# Patient Record
Sex: Female | Born: 2000 | Race: White | Hispanic: No | Marital: Single | Smoking: Never smoker
Health system: Southern US, Community
[De-identification: ages and names within clinical notes are randomized; demographics above are authoritative.]

## PROBLEM LIST (undated history)

## (undated) ENCOUNTER — Inpatient Hospital Stay: Payer: Self-pay

## (undated) DIAGNOSIS — F429 Obsessive-compulsive disorder, unspecified: Secondary | ICD-10-CM

## (undated) DIAGNOSIS — E785 Hyperlipidemia, unspecified: Secondary | ICD-10-CM

## (undated) DIAGNOSIS — F319 Bipolar disorder, unspecified: Secondary | ICD-10-CM

## (undated) DIAGNOSIS — K219 Gastro-esophageal reflux disease without esophagitis: Secondary | ICD-10-CM

## (undated) DIAGNOSIS — F913 Oppositional defiant disorder: Secondary | ICD-10-CM

## (undated) DIAGNOSIS — F988 Other specified behavioral and emotional disorders with onset usually occurring in childhood and adolescence: Secondary | ICD-10-CM

## (undated) DIAGNOSIS — F419 Anxiety disorder, unspecified: Secondary | ICD-10-CM

## (undated) DIAGNOSIS — F329 Major depressive disorder, single episode, unspecified: Secondary | ICD-10-CM

## (undated) DIAGNOSIS — J45909 Unspecified asthma, uncomplicated: Secondary | ICD-10-CM

## (undated) DIAGNOSIS — F909 Attention-deficit hyperactivity disorder, unspecified type: Secondary | ICD-10-CM

## (undated) DIAGNOSIS — F32A Depression, unspecified: Secondary | ICD-10-CM

## (undated) HISTORY — DX: Other specified behavioral and emotional disorders with onset usually occurring in childhood and adolescence: F98.8

## (undated) HISTORY — DX: Unspecified asthma, uncomplicated: J45.909

## (undated) HISTORY — DX: Hyperlipidemia, unspecified: E78.5

## (undated) HISTORY — DX: Gastro-esophageal reflux disease without esophagitis: K21.9

## (undated) HISTORY — DX: Obsessive-compulsive disorder, unspecified: F42.9

---
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
## 2019-08-19 ENCOUNTER — Other Ambulatory Visit: Payer: Self-pay

## 2019-08-19 DIAGNOSIS — Z79899 Other long term (current) drug therapy: Secondary | ICD-10-CM | POA: Diagnosis not present

## 2019-08-19 DIAGNOSIS — J45909 Unspecified asthma, uncomplicated: Secondary | ICD-10-CM | POA: Insufficient documentation

## 2019-08-19 DIAGNOSIS — Z91040 Latex allergy status: Secondary | ICD-10-CM | POA: Diagnosis not present

## 2019-08-19 DIAGNOSIS — J069 Acute upper respiratory infection, unspecified: Secondary | ICD-10-CM | POA: Insufficient documentation

## 2019-08-19 DIAGNOSIS — R05 Cough: Secondary | ICD-10-CM | POA: Diagnosis present

## 2019-08-19 DIAGNOSIS — Z20822 Contact with and (suspected) exposure to covid-19: Secondary | ICD-10-CM | POA: Insufficient documentation

## 2019-08-19 NOTE — ED Triage Notes (Signed)
Fever, cough and "covid sx" that started today. Pt has had close contact with two known positives. Pt alert and oriented X4, cooperative, RR even and unlabored, color WNL. Pt in NAD. No tylenol or OTC medications for fever taken PTA.

## 2019-08-20 ENCOUNTER — Emergency Department
Admission: EM | Admit: 2019-08-20 | Discharge: 2019-08-20 | Disposition: A | Discharge status: Home / Self Care | Payer: Medicaid Other | Attending: Emergency Medicine | Admitting: Emergency Medicine

## 2019-08-20 ENCOUNTER — Emergency Department: Payer: Medicaid Other

## 2019-08-20 ENCOUNTER — Other Ambulatory Visit: Payer: Self-pay

## 2019-08-20 DIAGNOSIS — Z20822 Contact with and (suspected) exposure to covid-19: Secondary | ICD-10-CM

## 2019-08-20 DIAGNOSIS — J069 Acute upper respiratory infection, unspecified: Secondary | ICD-10-CM

## 2019-08-20 LAB — BASIC METABOLIC PANEL
Anion gap: 8 (ref 5–15)
BUN: 10 mg/dL (ref 6–20)
CO2: 24 mmol/L (ref 22–32)
Calcium: 9 mg/dL (ref 8.9–10.3)
Chloride: 105 mmol/L (ref 98–111)
Creatinine, Ser: 0.61 mg/dL (ref 0.44–1.00)
GFR calc Af Amer: >60 mL/min (ref >60)
GFR calc non Af Amer: >60 mL/min (ref >60)
Glucose, Bld: 100 mg/dL — ABNORMAL HIGH (ref 70–99)
Potassium: 3.7 mmol/L (ref 3.5–5.1)
Sodium: 137 mmol/L (ref 135–145)

## 2019-08-20 LAB — CBC WITH DIFFERENTIAL/PLATELET
Abs Immature Granulocytes: 0.03 10*3/uL (ref 0.00–0.07)
Basophils Absolute: 0.1 10*3/uL (ref 0.0–0.1)
Basophils Relative: 1 %
Eosinophils Absolute: 0.3 10*3/uL (ref 0.0–0.5)
Eosinophils Relative: 4 %
HCT: 38.1 % (ref 36.0–46.0)
Hemoglobin: 12.4 g/dL (ref 12.0–15.0)
Immature Granulocytes: 0 %
Lymphocytes Relative: 23 %
Lymphs Abs: 1.7 10*3/uL (ref 0.7–4.0)
MCH: 27.4 pg (ref 26.0–34.0)
MCHC: 32.5 g/dL (ref 30.0–36.0)
MCV: 84.1 fL (ref 80.0–100.0)
Monocytes Absolute: 0.5 10*3/uL (ref 0.1–1.0)
Monocytes Relative: 6 %
Neutro Abs: 5 10*3/uL (ref 1.7–7.7)
Neutrophils Relative %: 66 %
Platelets: 373 10*3/uL (ref 150–400)
RBC: 4.53 MIL/uL (ref 3.87–5.11)
RDW: 12.1 % (ref 11.5–15.5)
WBC: 7.6 10*3/uL (ref 4.0–10.5)
nRBC: 0 % (ref 0.0–0.2)

## 2019-08-20 LAB — INFLUENZA PANEL BY PCR (TYPE A & B)
Influenza A By PCR: NEGATIVE
Influenza B By PCR: NEGATIVE

## 2019-08-20 LAB — TROPONIN I (HIGH SENSITIVITY): Troponin I (High Sensitivity): <2 ng/L (ref <18)

## 2019-08-20 LAB — SARS CORONAVIRUS 2 (TAT 6-24 HRS): SARS Coronavirus 2: NEGATIVE

## 2019-08-20 MED ORDER — SODIUM CHLORIDE 0.9 % IV BOLUS
1000.0000 mL | Freq: Once | INTRAVENOUS | Status: AC
  Administered 2019-08-20: 1000 mL via INTRAVENOUS

## 2019-08-20 MED ORDER — ONDANSETRON 4 MG PO TBDP: 4.0000 mg | ORAL_TABLET | Freq: Three times a day (TID) | ORAL | 0 refills | Status: DC | PRN

## 2019-08-20 MED ORDER — ACETAMINOPHEN 500 MG PO TABS
1000.0000 mg | ORAL_TABLET | Freq: Once | ORAL | Status: AC
  Administered 2019-08-20: 1000 mg via ORAL
  Filled 2019-08-20: qty 2

## 2019-08-20 MED ORDER — ALBUTEROL SULFATE HFA 108 (90 BASE) MCG/ACT IN AERS: 2.0000 | INHALATION_SPRAY | Freq: Four times a day (QID) | RESPIRATORY_TRACT | 1 refills | Status: DC | PRN

## 2019-08-20 MED ORDER — ONDANSETRON HCL 4 MG/2ML IJ SOLN
4.0000 mg | Freq: Once | INTRAMUSCULAR | Status: AC
  Administered 2019-08-20: 06:00:00 4 mg via INTRAVENOUS
  Filled 2019-08-20: qty 2

## 2019-08-20 MED ORDER — IOHEXOL 350 MG/ML SOLN
100.0000 mL | Freq: Once | INTRAVENOUS | Status: AC | PRN
  Administered 2019-08-20: 100 mL via INTRAVENOUS

## 2019-08-20 NOTE — ED Notes (Signed)
Patient transported to CT 

## 2019-08-20 NOTE — ED Notes (Signed)
Patient ambulatory to lobby with steady gait and NAD noted. Verbalized understanding of discharge instructions and follow-up care.  
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
## 2019-08-20 NOTE — ED Provider Notes (Signed)
[facility] Emergency Department Provider Note  ____________________________________________  Time seen: Approximately 4:54 AM  I have reviewed the triage vital signs and the nursing notes.   HISTORY  Chief Complaint Fever and Cough   HPI Tanya Mckee is a 19 y.o. female with a history of asthma, GERD, hyperlipidemia, and obesity who presents for evaluation of Covid-like symptoms. Her symptoms started today. She is complaining of body aches, fever of 100F, cough productive of yellow phlegm, sore throat, chest pain, nausea and vomiting. She describes the chest pain as sharp, intermittent, located in the center of her chest, that is present when she coughs. She denies personal or family history of blood clots, recent travel immobilization, leg pain or swelling, hemoptysis. She does have a Mirena IUD. She has no chest pain at this time. She reports  the 2 of her close friends have had similar symptoms and they both were tested for Covid but the results are not back yet.  Past Medical History:  Diagnosis Date  . ADD (attention deficit disorder)   . ADHD (attention deficit hyperactivity disorder)   . Anxiety   . Asthma   . Depression   . GERD (gastroesophageal reflux disease)   . Hyperlipidemia     Patient Active Problem List   Diagnosis Date Noted  . Postpartum care following vaginal delivery 07/19/2017  . Gestational hypertension 07/16/2017  . Abdominal pain affecting pregnancy 05/29/2017  . Monilial vulvovaginitis 05/29/2017  . UTI (urinary tract infection) during pregnancy, third trimester 05/29/2017  . Dyspnea on exertion 10/20/2015  . Fatigue 10/20/2015  . Elevated blood pressure 06/16/2015  . 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 Allergic rhinitis 05/21/2015  . Asthma 05/21/2015  . GERD (gastroesophageal reflux disease) 05/21/2015  . HLD (hyperlipidemia) 05/21/2015  . Extreme obesity 05/21/2015  . Apnea, sleep 05/21/2015      Past Surgical History:  Procedure Laterality Date  . ORIF ANKLE FRACTURE Right 02/11/2016   Procedure: OPEN REDUCTION INTERNAL FIXATION (ORIF) ANKLE FRACTURE;  Surgeon: Gwyneth Revels, DPM;  Location: [facility] ORS;  Service: Podiatry;  Laterality: Right;  . TONSILLECTOMY AND ADENOIDECTOMY  2007  . UPPER GI ENDOSCOPY  10/08/2009   Possible Esophagitis    Prior to Admission medications   Medication Sig Start Date End Date Taking? Authorizing Provider  acetaminophen (TYLENOL) 325 MG tablet Take 2 tablets (650 mg total) by mouth every 4 (four) hours as needed (for pain scale < 4). 07/21/17   Schuman, Jaquelyn Bitter, MD  albuterol (VENTOLIN HFA) 108 (90 Base) MCG/ACT inhaler Inhale 2 puffs into the lungs every 6 (six) hours as needed for wheezing or shortness of breath. 08/20/19   Nita Sickle, MD  budesonide-formoterol [facility]) 160-4.5 MCG/ACT inhaler Inhale 2 puffs into the lungs 2 (two) times daily.    [provider]  ferrous sulfate 325 (65 FE) MG EC tablet Take 325 mg by mouth daily with breakfast.    [provider]  labetalol (NORMODYNE) 200 MG tablet Take 1 tablet (200 mg total) by mouth 2 (two) times daily. 07/21/17   Schuman, Christanna R, MD  ondansetron (ZOFRAN ODT) 4 MG disintegrating tablet Take 1 tablet (4 mg total) by mouth every 8 (eight) hours as needed. 08/20/19   Nita Sickle, MD  pantoprazole (PROTONIX) 40 MG tablet Take 1 tablet (40 mg total) by mouth daily. 11/21/17 11/21/18  Jeanmarie Plant, MD  Prenatal Vit-Fe Fumarate-FA (PRENATAL MULTIVITAMIN) TABS tablet Take 1 tablet by mouth daily at 12 noon.  [provider]  ranitidine (ZANTAC) 150 MG tablet TAKE 1 TABLET BY MOUTH ONCE DAILY Patient not taking: Reported on 07/16/2017 12/13/15   Malva Limes, MD  triamcinolone cream (KENALOG) 0.1 % Apply to rash on abdomen BID prn 05/29/17   Farrel Conners, CNM    Allergies Aspirin, Cetirizine, Latex, and Other  Family History  Problem  Relation Age of Onset  . Seizures Mother   . Stroke Mother   . Evelene Croon Parkinson White syndrome Father   . Breast cancer Maternal Grandmother   . Lupus Maternal Grandmother   . Heart attack Maternal Grandfather   . CAD Maternal Grandfather        has stent  . Bone cancer Maternal Grandfather   . Kidney failure Paternal Grandmother   . Hypertension Paternal Grandfather   . CVA Paternal Grandfather   . Diabetes Other   . Diabetes Other   . Breast cancer Other   . Epilepsy Maternal Uncle     Social History Social History   Tobacco Use  . Smoking status: Never Smoker  . Smokeless tobacco: Never Used  Substance Use Topics  . Alcohol use: No    Alcohol/week: 0.0 standard drinks  . Drug use: No    Review of Systems  Constitutional: + fever, body aches Eyes: Negative for visual changes. ENT: + sore throat. Neck: No neck pain  Cardiovascular: + chest pain. Respiratory: Negative for shortness of breath. + cough Gastrointestinal: Negative for abdominal pain, diarrhea. + N/V Genitourinary: Negative for dysuria. Musculoskeletal: Negative for back pain. Skin: Negative for rash. Neurological: Negative for headaches, weakness or numbness. Psych: No SI or HI  ____________________________________________   PHYSICAL EXAM:  VITAL SIGNS: ED Triage Vitals [08/20/19 0000]  Enc Vitals Group     BP (!) 162/95     Pulse Rate (!) 101     Resp 18     Temp 98.9 F (37.2 C)     Temp Source Oral     SpO2 97 %     Weight 250 lb (113.4 kg)     Height 5\' 5"  (1.651 m)     Head Circumference      Peak Flow      Pain Score 0     Pain Loc      Pain Edu?      Excl. in GC?     Constitutional: Alert and oriented. Well appearing and in no apparent distress. HEENT:      Head: Normocephalic and atraumatic.         Eyes: Conjunctivae are normal. Sclera is non-icteric.       Mouth/Throat: Mucous membranes are moist.       Neck: Supple with no signs of meningismus. Cardiovascular: Regular  rate and rhythm. No murmurs, gallops, or rubs. 2+ symmetrical distal pulses are present in all extremities. No JVD. Respiratory: Normal respiratory effort. Lungs are clear to auscultation bilaterally with slightly diminished air movement bilaterally. No wheezes, crackles, or rhonchi.  Gastrointestinal: Soft, non tender, and non distended with positive bowel sounds. No rebound or guarding. Musculoskeletal: Nontender with normal range of motion in all extremities. No edema, cyanosis, or erythema of extremities. Neurologic: Normal speech and language. Face is symmetric. Moving all extremities. No gross focal neurologic deficits are appreciated. Skin: Skin is warm, dry and intact. No rash noted. Psychiatric: Mood and affect are normal. Speech and behavior are normal.  ____________________________________________   LABS (all labs ordered are listed, but only abnormal results are displayed)  Labs Reviewed  BASIC METABOLIC PANEL - Abnormal; Notable for the following components:      Result Value   Glucose, Bld 100 (*)    All other components within normal limits  SARS CORONAVIRUS 2 (TAT 6-24 HRS)  CBC WITH DIFFERENTIAL/PLATELET  INFLUENZA PANEL BY PCR (TYPE A & B)  TROPONIN I (HIGH SENSITIVITY)   ____________________________________________  EKG  ED ECG REPORT I, Nita Sickle, the attending physician, personally viewed and interpreted this ECG.  Normal sinus rhythm, rate of 91, normal intervals, normal axis, no ST elevations or depressions.  Normal EKG. ____________________________________________  RADIOLOGY  I have personally reviewed the images performed during this visit and I agree with the Radiologist's read.   Interpretation by Radiologist:  CT Angio Chest PE W and/or Wo Contrast  Result Date: 08/20/2019 CLINICAL DATA:  Shortness of breath, fever, cough and ?COVID symptoms? which began today. Recent COVID exposure. EXAM: CT ANGIOGRAPHY CHEST WITH CONTRAST TECHNIQUE:  Multidetector CT imaging of the chest was performed using the standard protocol during bolus administration of intravenous contrast. Multiplanar CT image reconstructions and MIPs were obtained to evaluate the vascular anatomy. CONTRAST:  OMNIPAQUE IOHEXOL 350 MG/ML SOLN COMPARISON:  Radiograph 08/20/2019 FINDINGS: Cardiovascular: Satisfactory opacification the pulmonary arteries to the segmental level. Imaging quality is slightly degraded due to respiratory motion. No pulmonary artery filling defects are identified. Central pulmonary arteries are normal caliber. Normal heart size. No pericardial effusion. The aorta is normal caliber. Normal 3 vessel branching of the arch. Mediastinum/Nodes: Wedge-shaped soft tissue attenuation in the anterior mediastinum, separate from the thyroid gland, is favored to reflect thymic remnant in a patient of this age. Thyroid gland and thoracic inlet are unremarkable. No acute abnormality of the trachea or esophagus. Lungs/Pleura: No consolidation, features of edema, pneumothorax, or effusion. No suspicious pulmonary nodules or masses. Upper Abdomen: No acute abnormalities present in the visualized portions of the upper abdomen. Musculoskeletal: Multilevel degenerative changes are present in the imaged portions of the spine. No acute osseous abnormality or suspicious osseous lesion. Review of the MIP images confirms the above findings. IMPRESSION: 1. No evidence of pulmonary embolus or other acute cardiopulmonary process. 2. Wedge-shaped soft tissue attenuation in the anterior mediastinum, separate from the thyroid gland, is favored to reflect thymic remnant in a patient of this age. Electronically Signed   By: Kreg Shropshire M.D.   On: 08/20/2019 07:00   DG Chest Portable 1 View  Result Date: 08/20/2019 CLINICAL DATA:  Fever and cough.  COVID symptoms EXAM: PORTABLE CHEST 1 VIEW COMPARISON:  10/21/2015 FINDINGS: Low volume chest. There is no edema, consolidation, effusion, or  pneumothorax. Normal heart size and mediastinal contours. IMPRESSION: No evidence of active disease. Electronically Signed   By: Marnee Spring M.D.   On: 08/20/2019 04:41     ____________________________________________   PROCEDURES  Procedure(s) performed: None Procedures Critical Care performed:  None ____________________________________________   INITIAL IMPRESSION / ASSESSMENT AND PLAN / ED COURSE  19 y.o. female with a history of asthma, GERD, hyperlipidemia, and obesity who presents for evaluation of cough, fever, body aches, chest pain, nausea vomiting, sore throat. Differential diagnosis including Covid versus flu versus viral illness versus pneumonia. Patient is extremely well-appearing with normal work of breathing, normal sats, lungs are clear to auscultation with slightly diminished air movement with no wheezing or crackles, no edema of the lower extremities. Will swab patient for Covid and flu, will get chest x-ray to rule out pneumonia, will get EKG and labs. Will give  Zofran for nausea, Tylenol for body aches.    _________________________ 6:42 AM on 08/20/2019 -----------------------------------------  Patient with tachycardia, sharp CP, and IUD Mirena in the setting of Covid like symptoms make me consider PE as possibility.  Unfortunately due to lack of available reagent, her lab has been unable to run D-dimers and therefore patient will have to undergo a CT angio of the chest.  EKG and troponin with no signs of cardiac involvement.  Labs with no significant electrolyte derangements or acute kidney injury.  Flu is negative.  Covid is pending.  Chest x-ray with no acute findings.  _________________________ 7:14 AM on 08/20/2019 -----------------------------------------  CT negative for PE or any findings of Covid.  Patient be discharged home on supportive care, discussed quarantine and discussed my standard return precautions.  As part of my medical decision making, I  reviewed the following data within the [place] notes reviewed and incorporated, Labs reviewed , EKG interpreted , Old chart reviewed, Radiograph reviewed , Notes from prior ED visits and [place] Controlled Substance Database   Please note:  Patient was evaluated in Emergency Department today for the symptoms described in the history of present illness. Patient was evaluated in the context of the global COVID-19 pandemic, which necessitated consideration that the patient might be at risk for infection with the SARS-CoV-2 virus that causes COVID-19. Institutional protocols and algorithms that pertain to the evaluation of patients at risk for COVID-19 are in a state of rapid change based on information released by regulatory bodies including the CDC and federal and state organizations. These policies and algorithms were followed during the patient's care in the ED.  Some ED evaluations and interventions [place] be delayed as a result of limited staffing during the pandemic.   ____________________________________________   FINAL CLINICAL IMPRESSION(S) / ED DIAGNOSES   Final diagnoses:  Viral URI with cough  Suspected COVID-19 virus infection      NEW MEDICATIONS STARTED DURING THIS VISIT:  ED Discharge Orders         Ordered    ondansetron (ZOFRAN ODT) 4 MG disintegrating tablet  Every 8 hours PRN     08/20/19 0648    albuterol (VENTOLIN HFA) 108 (90 Base) MCG/ACT inhaler  Every 6 hours PRN     08/20/19 0648           Note:  This document was prepared using Dragon voice recognition software and [place] include unintentional dictation errors.    Alfred Levins, [place], MD 08/20/19 [phone]

## 2019-08-20 NOTE — Discharge Instructions (Signed)

## 2019-08-20 NOTE — ED Notes (Signed)
Pt found room att by call from EDP

## 2020-03-07 ENCOUNTER — Other Ambulatory Visit: Payer: Self-pay

## 2020-03-07 ENCOUNTER — Encounter: Payer: Self-pay | Admitting: Emergency Medicine

## 2020-03-07 ENCOUNTER — Emergency Department: Payer: Medicaid Other

## 2020-03-07 ENCOUNTER — Emergency Department
Admission: EM | Admit: 2020-03-07 | Discharge: 2020-03-07 | Disposition: A | Discharge status: Home / Self Care | Payer: Medicaid Other | Attending: Emergency Medicine | Admitting: Emergency Medicine

## 2020-03-07 DIAGNOSIS — Z79899 Other long term (current) drug therapy: Secondary | ICD-10-CM | POA: Diagnosis not present

## 2020-03-07 DIAGNOSIS — J45909 Unspecified asthma, uncomplicated: Secondary | ICD-10-CM | POA: Diagnosis not present

## 2020-03-07 DIAGNOSIS — S59901A Unspecified injury of right elbow, initial encounter: Secondary | ICD-10-CM | POA: Diagnosis present

## 2020-03-07 DIAGNOSIS — Y929 Unspecified place or not applicable: Secondary | ICD-10-CM | POA: Diagnosis not present

## 2020-03-07 DIAGNOSIS — Z91040 Latex allergy status: Secondary | ICD-10-CM | POA: Insufficient documentation

## 2020-03-07 DIAGNOSIS — W08XXXA Fall from other furniture, initial encounter: Secondary | ICD-10-CM | POA: Diagnosis not present

## 2020-03-07 DIAGNOSIS — M25571 Pain in right ankle and joints of right foot: Secondary | ICD-10-CM | POA: Diagnosis not present

## 2020-03-07 DIAGNOSIS — Y999 Unspecified external cause status: Secondary | ICD-10-CM | POA: Insufficient documentation

## 2020-03-07 DIAGNOSIS — Y939 Activity, unspecified: Secondary | ICD-10-CM | POA: Insufficient documentation

## 2020-03-07 DIAGNOSIS — S50311A Abrasion of right elbow, initial encounter: Secondary | ICD-10-CM | POA: Diagnosis not present

## 2020-03-07 DIAGNOSIS — W19XXXA Unspecified fall, initial encounter: Secondary | ICD-10-CM

## 2020-03-07 MED ORDER — MELOXICAM 15 MG PO TABS: 15.0000 mg | ORAL_TABLET | Freq: Every day | ORAL | 1 refills | Status: AC

## 2020-03-07 MED ORDER — MELOXICAM 15 MG PO TABS: 15.0000 mg | ORAL_TABLET | Freq: Every day | ORAL | 1 refills | Status: DC

## 2020-03-07 NOTE — ED Provider Notes (Signed)
Emergency Department Provider Note  ____________________________________________  Time seen: Approximately 10:00 PM  I have reviewed the triage vital signs and the nursing notes.   HISTORY  Chief Complaint Fall   Historian Patient     HPI Tanya Mckee is a 19 y.o. female presents to the emergency department with right elbow and right ankle pain after patient had a mechanical fall while moving furniture.  She denies hitting her head or her neck.  No numbness or tingling in the upper and lower extremities.  She denies chest pain, chest tightness or abdominal pain.  Patient has sustained some minor abrasions on right elbow.   Past Medical History:  Diagnosis Date  . ADD (attention deficit disorder)   . ADHD (attention deficit hyperactivity disorder)   . Anxiety   . Asthma   . Depression   . GERD (gastroesophageal reflux disease)   . Hyperlipidemia      Immunizations up to date:  Yes.     Past Medical History:  Diagnosis Date  . ADD (attention deficit disorder)   . ADHD (attention deficit hyperactivity disorder)   . Anxiety   . Asthma   . Depression   . GERD (gastroesophageal reflux disease)   . Hyperlipidemia     Patient Active Problem List   Diagnosis Date Noted  . Postpartum care following vaginal delivery 07/19/2017  . Gestational hypertension 07/16/2017  . Abdominal pain affecting pregnancy 05/29/2017  . Monilial vulvovaginitis 05/29/2017  . UTI (urinary tract infection) during pregnancy, third trimester 05/29/2017  . Dyspnea on exertion 10/20/2015  . Fatigue 10/20/2015  . Elevated blood pressure 06/16/2015  . 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 Allergic rhinitis 05/21/2015  . Asthma 05/21/2015  . GERD (gastroesophageal reflux disease) 05/21/2015  . HLD (hyperlipidemia) 05/21/2015  . Extreme obesity 05/21/2015  . Apnea, sleep 05/21/2015    Past Surgical History:  Procedure Laterality Date  . ORIF ANKLE FRACTURE Right  02/11/2016   Procedure: OPEN REDUCTION INTERNAL FIXATION (ORIF) ANKLE FRACTURE;  Surgeon: Gwyneth Revels, DPM;  Location: [facility] ORS;  Service: Podiatry;  Laterality: Right;  . TONSILLECTOMY AND ADENOIDECTOMY  2007  . UPPER GI ENDOSCOPY  10/08/2009   Possible Esophagitis    Prior to Admission medications   Medication Sig Start Date End Date Taking? Authorizing Provider  acetaminophen (TYLENOL) 325 MG tablet Take 2 tablets (650 mg total) by mouth every 4 (four) hours as needed (for pain scale < 4). 07/21/17   Schuman, Jaquelyn Bitter, MD  albuterol (VENTOLIN HFA) 108 (90 Base) MCG/ACT inhaler Inhale 2 puffs into the lungs every 6 (six) hours as needed for wheezing or shortness of breath. 08/20/19   Nita Sickle, MD  budesonide-formoterol [facility]) 160-4.5 MCG/ACT inhaler Inhale 2 puffs into the lungs 2 (two) times daily.    [provider]  ferrous sulfate 325 (65 FE) MG EC tablet Take 325 mg by mouth daily with breakfast.    [provider]  labetalol (NORMODYNE) 200 MG tablet Take 1 tablet (200 mg total) by mouth 2 (two) times daily. 07/21/17   Schuman, Jaquelyn Bitter, MD  meloxicam (MOBIC) 15 MG tablet Take 1 tablet (15 mg total) by mouth daily for 7 days. 03/07/20 03/14/20  Orvil Feil, PA-C  ondansetron (ZOFRAN ODT) 4 MG disintegrating tablet Take 1 tablet (4 mg total) by mouth every 8 (eight) hours as needed. 08/20/19   Nita Sickle, MD  pantoprazole (PROTONIX) 40 MG tablet Take 1 tablet (40 mg total) by mouth daily. 11/21/17 11/21/18  Jeanmarie Plant, MD  Prenatal Vit-Fe Fumarate-FA (PRENATAL MULTIVITAMIN) TABS tablet Take 1 tablet by mouth daily at 12 noon.    [provider]  ranitidine (ZANTAC) 150 MG tablet TAKE 1 TABLET BY MOUTH ONCE DAILY Patient not taking: Reported on 07/16/2017 12/13/15   Malva Limes, MD  triamcinolone cream (KENALOG) 0.1 % Apply to rash on abdomen BID prn 05/29/17   Farrel Conners, CNM    Allergies Aspirin, Cetirizine, Latex, and  Other  Family History  Problem Relation Age of Onset  . Seizures Mother   . Stroke Mother   . Evelene Croon Parkinson White syndrome Father   . Breast cancer Maternal Grandmother   . Lupus Maternal Grandmother   . Heart attack Maternal Grandfather   . CAD Maternal Grandfather        has stent  . Bone cancer Maternal Grandfather   . Kidney failure Paternal Grandmother   . Hypertension Paternal Grandfather   . CVA Paternal Grandfather   . Diabetes Other   . Diabetes Other   . Breast cancer Other   . Epilepsy Maternal Uncle     Social History Social History   Tobacco Use  . Smoking status: Never Smoker  . Smokeless tobacco: Never Used  Vaping Use  . Vaping Use: Never used  Substance Use Topics  . Alcohol use: No    Alcohol/week: 0.0 standard drinks  . Drug use: No     Review of Systems  Constitutional: No fever/chills Eyes:  No discharge ENT: No upper respiratory complaints. Respiratory: no cough. No SOB/ use of accessory muscles to breath Gastrointestinal:   No nausea, no vomiting.  No diarrhea.  No constipation. Musculoskeletal: Patient has right elbow and right ankle pain.  Skin: Negative for rash, abrasions, lacerations, ecchymosis.    ____________________________________________   PHYSICAL EXAM:  VITAL SIGNS: ED Triage Vitals  Enc Vitals Group     BP 03/07/20 1933 (!) 189/85     Pulse Rate 03/07/20 1933 94     Resp 03/07/20 1933 18     Temp 03/07/20 1933 99 F (37.2 C)     Temp Source 03/07/20 1933 Oral     SpO2 03/07/20 1933 99 %     Weight 03/07/20 1934 (!) 270 lb (122.5 kg)     Height 03/07/20 1934 5\' 5"  (1.651 m)     Head Circumference --      Peak Flow --      Pain Score 03/07/20 1934 7     Pain Loc --      Pain Edu? --      Excl. in GC? --      Constitutional: Alert and oriented. Well appearing and in no acute distress. Eyes: Conjunctivae are normal. PERRL. EOMI. Head: Atraumatic. Cardiovascular: Normal rate, regular rhythm. Normal S1 and  S2.  Good peripheral circulation. Respiratory: Normal respiratory effort without tachypnea or retractions. Lungs CTAB. Good air entry to the bases with no decreased or absent breath sounds Gastrointestinal: Bowel sounds x 4 quadrants. Soft and nontender to palpation. No guarding or rigidity. No distention. Musculoskeletal: Full range of motion to all extremities. No obvious deformities noted.  Patient demonstrates full range of motion at the right elbow and right ankle. Neurologic:  Normal for age. No gross focal neurologic deficits are appreciated.  Skin:  Skin is warm, dry and intact. No rash noted. Psychiatric: Mood and affect are normal for age. Speech and behavior are normal.   ____________________________________________   LABS (all labs ordered  are listed, but only abnormal results are displayed)  Labs Reviewed - No data to display ____________________________________________  EKG   ____________________________________________  RADIOLOGY Geraldo Pitter, personally viewed and evaluated these images (plain radiographs) as part of my medical decision making, as well as reviewing the written report by the radiologist.  DG Elbow Complete Right  Result Date: 03/07/2020 CLINICAL DATA:  19 year old female with fall and trauma to the right elbow. EXAM: RIGHT ELBOW - COMPLETE 3+ VIEW COMPARISON:  None. FINDINGS: There is no evidence of fracture, dislocation, or joint effusion. There is no evidence of arthropathy or other focal bone abnormality. Soft tissues are unremarkable. IMPRESSION: Negative. Electronically Signed   By: Elgie Collard M.D.   On: 03/07/2020 20:04   DG Ankle Complete Right  Result Date: 03/07/2020 CLINICAL DATA:  Recent fall with ankle pain EXAM: RIGHT ANKLE - COMPLETE 3+ VIEW COMPARISON:  02/05/2016, 02/11/2016 FINDINGS: Postsurgical changes are noted in the distal fibula and tibia. No acute fracture is seen. 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 Generalized soft tissue swelling about the ankle is seen. IMPRESSION: Postsurgical changes in the distal tibia and fibula. Mild soft tissue swelling is noted. Electronically Signed   By: Alcide Clever M.D.   On: 03/07/2020 20:05    ____________________________________________    PROCEDURES  Procedure(s) performed:     Procedures     Medications - No data to display   ____________________________________________   INITIAL IMPRESSION / ASSESSMENT AND PLAN / ED COURSE  Pertinent labs & imaging results that were available during my care of the patient were reviewed by me and considered in my medical decision making (see chart for details).      Assessment and plan Fall 19 year old female presents to the emergency department after a mechanical, nonsyncopal fall.  Vital signs are reassuring at triage.  X-ray of the right elbow and the right ankle revealed no acute bony abnormalities.  Patient was prescribed meloxicam for pain and inflammation.  She was advised to follow-up with primary care as needed.  ____________________________________________  FINAL CLINICAL IMPRESSION(S) / ED DIAGNOSES  Final diagnoses:  Fall, initial encounter      NEW MEDICATIONS STARTED DURING THIS VISIT:  ED Discharge Orders         Ordered    meloxicam (MOBIC) 15 MG tablet  Daily,   Status:  Discontinued     Reprint     03/07/20 2046    meloxicam (MOBIC) 15 MG tablet  Daily     Discontinue  Reprint     03/07/20 2054              This chart was dictated using voice recognition software/Dragon. Despite best efforts to proofread, errors can occur which can change the meaning. Any change was purely unintentional.     Orvil Feil, PA-C 03/07/20 2203    Emily Filbert, MD 03/07/20 2220

## 2020-03-07 NOTE — ED Triage Notes (Signed)
Patient states that she fell yesterday moving furniture. Patient with complaint of right elbow and right ankle pain. Patient states that she has a metal plate in her right ankle.

## 2020-03-07 NOTE — Discharge Instructions (Signed)
Take meloxicam daily for pain and inflammation.

## 2020-04-29 ENCOUNTER — Emergency Department
Admission: EM | Admit: 2020-04-29 | Discharge: 2020-04-29 | Disposition: A | Discharge status: Home / Self Care | Payer: Medicaid Other | Attending: Emergency Medicine | Admitting: Emergency Medicine

## 2020-04-29 ENCOUNTER — Other Ambulatory Visit: Payer: Self-pay

## 2020-04-29 ENCOUNTER — Encounter: Payer: Self-pay | Admitting: Emergency Medicine

## 2020-04-29 ENCOUNTER — Emergency Department: Payer: Medicaid Other

## 2020-04-29 DIAGNOSIS — Z79899 Other long term (current) drug therapy: Secondary | ICD-10-CM | POA: Insufficient documentation

## 2020-04-29 DIAGNOSIS — W19XXXA Unspecified fall, initial encounter: Secondary | ICD-10-CM | POA: Diagnosis not present

## 2020-04-29 DIAGNOSIS — S8002XA Contusion of left knee, initial encounter: Secondary | ICD-10-CM | POA: Diagnosis present

## 2020-04-29 DIAGNOSIS — J45909 Unspecified asthma, uncomplicated: Secondary | ICD-10-CM | POA: Diagnosis not present

## 2020-04-29 DIAGNOSIS — Y93K1 Activity, walking an animal: Secondary | ICD-10-CM | POA: Insufficient documentation

## 2020-04-29 DIAGNOSIS — Z91040 Latex allergy status: Secondary | ICD-10-CM | POA: Diagnosis not present

## 2020-04-29 NOTE — ED Triage Notes (Signed)
Pt states was walking dog, got tangled in her leash  and fell hurting her left knee.

## 2020-04-29 NOTE — ED Provider Notes (Signed)
[facility] Emergency Department Provider Note   ____________________________________________   First MD Initiated Contact with Patient 04/29/20 [phone]     (approximate)  I have reviewed the triage vital signs and the nursing notes.   HISTORY  Chief Complaint Knee Pain and Fall   HPI Tanya Mckee is a 19 y.o. female presents to the ED with complaint of left knee pain after falling this morning while walking the dog.  Patient has continued to ambulate since that time.  She denies any head injury or loss of consciousness.  Her pain is 5 out of 10.        Past Medical History:  Diagnosis Date   ADD (attention deficit disorder)    ADHD (attention deficit hyperactivity disorder)    Anxiety    Asthma    Depression    GERD (gastroesophageal reflux disease)    Hyperlipidemia     Patient Active Problem List   Diagnosis Date Noted   Postpartum care following vaginal delivery 07/19/2017   Gestational hypertension 07/16/2017   Abdominal pain affecting pregnancy 05/29/2017   Monilial vulvovaginitis 05/29/2017   UTI (urinary tract infection) during pregnancy, third trimester 05/29/2017   Dyspnea on exertion 10/20/2015   Fatigue 10/20/2015   Elevated blood pressure 06/16/2015   Attention deficit disorder with hyperactivity 05/21/2015   Allergic rhinitis 05/21/2015   Asthma 05/21/2015   GERD (gastroesophageal reflux disease) 05/21/2015   HLD (hyperlipidemia) 05/21/2015   Extreme obesity 05/21/2015   Apnea, sleep 05/21/2015    Past Surgical History:  Procedure Laterality Date   ORIF ANKLE FRACTURE Right 02/11/2016   Procedure: OPEN REDUCTION INTERNAL FIXATION (ORIF) ANKLE FRACTURE;  Surgeon: Gwyneth Revels, DPM;  Location: [facility] ORS;  Service: Podiatry;  Laterality: Right;   TONSILLECTOMY AND ADENOIDECTOMY  2007   UPPER GI ENDOSCOPY  10/08/2009   Possible Esophagitis    Prior to Admission medications   Medication Sig Start  Date End Date Taking? Authorizing Provider  acetaminophen (TYLENOL) 325 MG tablet Take 2 tablets (650 mg total) by mouth every 4 (four) hours as needed (for pain scale < 4). 07/21/17   Schuman, Jaquelyn Bitter, MD  albuterol (VENTOLIN HFA) 108 (90 Base) MCG/ACT inhaler Inhale 2 puffs into the lungs every 6 (six) hours as needed for wheezing or shortness of breath. 08/20/19   Nita Sickle, MD  budesonide-formoterol [facility]) 160-4.5 MCG/ACT inhaler Inhale 2 puffs into the lungs 2 (two) times daily.    [provider]  ferrous sulfate 325 (65 FE) MG EC tablet Take 325 mg by mouth daily with breakfast.    [provider]  labetalol (NORMODYNE) 200 MG tablet Take 1 tablet (200 mg total) by mouth 2 (two) times daily. 07/21/17   Schuman, Christanna R, MD  ondansetron (ZOFRAN ODT) 4 MG disintegrating tablet Take 1 tablet (4 mg total) by mouth every 8 (eight) hours as needed. 08/20/19   Nita Sickle, MD  pantoprazole (PROTONIX) 40 MG tablet Take 1 tablet (40 mg total) by mouth daily. 11/21/17 11/21/18  Jeanmarie Plant, MD  triamcinolone cream (KENALOG) 0.1 % Apply to rash on abdomen BID prn 05/29/17   Farrel Conners, CNM    Allergies Aspirin, Cetirizine, Latex, and Other  Family History  Problem Relation Age of Onset   Seizures Mother    Stroke Mother    Phillips Odor White syndrome Father    Breast cancer Maternal Grandmother    Lupus Maternal Grandmother    Heart attack Maternal Grandfather  CAD Maternal Grandfather        has stent   Bone cancer Maternal Grandfather    Kidney failure Paternal Grandmother    Hypertension Paternal Grandfather    CVA Paternal Grandfather    Diabetes Other    Diabetes Other    Breast cancer Other    Epilepsy Maternal Uncle     Social History Social History   Tobacco Use   Smoking status: Never Smoker   Smokeless tobacco: Never Used  Building services engineer Use: Never used  Substance Use Topics   Alcohol  use: No    Alcohol/week: 0.0 standard drinks   Drug use: No    Review of Systems Constitutional: No fever/chills Eyes: No visual changes. ENT: No sore throat. Cardiovascular: Denies chest pain. Respiratory: Denies shortness of breath. Gastrointestinal: No abdominal pain.  No nausea, no vomiting.  Musculoskeletal: Positive for left knee pain. Skin: Negative for rash. Neurological: Negative for headaches, focal weakness or numbness.  ____________________________________________   PHYSICAL EXAM:  VITAL SIGNS: ED Triage Vitals  Enc Vitals Group     BP 04/29/20 0911 (!) 145/89     Pulse Rate 04/29/20 0911 82     Resp 04/29/20 0911 16     Temp 04/29/20 0911 98.3 F (36.8 C)     Temp Source 04/29/20 0911 Oral     SpO2 04/29/20 0911 99 %     Weight 04/29/20 0846 250 lb (113.4 kg)     Height 04/29/20 0846 5\' 5"  (1.651 m)     Head Circumference --      Peak Flow --      Pain Score 04/29/20 0846 5     Pain Loc --      Pain Edu? --      Excl. in GC? --     Constitutional: Alert and oriented. Well appearing and in no acute distress. Eyes: Conjunctivae are normal.  Head: Atraumatic. Neck: No stridor.   Cardiovascular: Normal rate, regular rhythm. Grossly normal heart sounds.  Good peripheral circulation. Respiratory: Normal respiratory effort.  No retractions. Lungs CTAB. Gastrointestinal: Soft and nontender. No distention. Musculoskeletal: On examination of the left knee there is no gross deformity or effusion present.  Patient is able to flex and extend her lower extremity without any difficulty.  There is no skin discoloration or open wounds.  Patient has some mild generalized tenderness on the anterior portion of her left knee. Neurologic:  Normal speech and language. No gross focal neurologic deficits are appreciated. No gait instability. Skin:  Skin is warm, dry and intact.  No discoloration noted. Psychiatric: Mood and affect are normal. Speech and behavior are  normal.  ____________________________________________   LABS (all labs ordered are listed, but only abnormal results are displayed)  Labs Reviewed - No data to display  RADIOLOGY   Official radiology report(s): DG Knee Complete 4 Views Left  Result Date: 04/29/2020 CLINICAL DATA:  Left knee pain after fall. EXAM: LEFT KNEE - COMPLETE 4+ VIEW COMPARISON:  None. FINDINGS: No evidence of fracture, dislocation, or joint effusion. No evidence of arthropathy or other focal bone abnormality. Soft tissues are unremarkable. IMPRESSION: Negative. Electronically Signed   By: 05/01/2020 M.D.   On: 04/29/2020 09:14    ____________________________________________   PROCEDURES  Procedure(s) performed (including Critical Care):  Procedures   ____________________________________________   INITIAL IMPRESSION / ASSESSMENT AND PLAN / ED COURSE  As part of my medical decision making, I reviewed the following data within the  electronic MEDICAL RECORD NUMBER Notes from prior ED visits and [place] Controlled Substance Database  19 year old female presents to the ED with complaint of left knee pain after falling while walking her dog this morning.  She denies any head injury or loss of consciousness.  Physical exam is benign with the exception of some diffuse tenderness on palpation of the anterior portion of the patella.  X-rays negative.  Patient was encouraged to use ice and elevation.  She will follow-up with her PCP if any continued problems.  ____________________________________________   FINAL CLINICAL IMPRESSION(S) / ED DIAGNOSES  Final diagnoses:  Contusion of left knee, initial encounter     ED Discharge Orders    None      *Please note:  Tanya Mckee was evaluated in Emergency Department on 04/29/2020 for the symptoms described in the history of present illness. She was evaluated in the context of the global COVID-19 pandemic, which necessitated consideration that the patient might be  at risk for infection with the SARS-CoV-2 virus that causes COVID-19. Institutional protocols and algorithms that pertain to the evaluation of patients at risk for COVID-19 are in a state of rapid change based on information released by regulatory bodies including the CDC and federal and state organizations. These policies and algorithms were followed during the patient's care in the ED.  Some ED evaluations and interventions Reep be delayed as a result of limited staffing during and the pandemic.*   Note:  This document was prepared using Dragon voice recognition software and [place] include unintentional dictation errors.    Tommi Rumps, PA-C 04/29/20 1155    Jene Every, MD 04/29/20 1323

## 2020-04-29 NOTE — ED Notes (Signed)
See triage note  Presents s/p fall  States she was walking a dog  And fell  Having pain to lateral aspect on left knee

## 2020-04-29 NOTE — Discharge Instructions (Addendum)
Follow-up with your primary care provider if any continued problems.  Ice and elevation today.  Take Tylenol if needed for pain.

## 2020-06-18 ENCOUNTER — Ambulatory Visit: Payer: Self-pay

## 2020-06-21 ENCOUNTER — Ambulatory Visit: Payer: Self-pay

## 2020-07-15 ENCOUNTER — Ambulatory Visit: Payer: Self-pay

## 2020-07-15 ENCOUNTER — Other Ambulatory Visit: Payer: Self-pay

## 2020-07-15 ENCOUNTER — Ambulatory Visit (LOCAL_COMMUNITY_HEALTH_CENTER): Payer: Medicaid Other | Admitting: Physician Assistant

## 2020-07-15 VITALS — BP 131/88 | Ht 65.0 in | Wt 244.8 lb

## 2020-07-15 DIAGNOSIS — Z3009 Encounter for other general counseling and advice on contraception: Secondary | ICD-10-CM | POA: Diagnosis not present

## 2020-07-15 DIAGNOSIS — Z3046 Encounter for surveillance of implantable subdermal contraceptive: Secondary | ICD-10-CM | POA: Diagnosis not present

## 2020-07-15 DIAGNOSIS — N72 Inflammatory disease of cervix uteri: Secondary | ICD-10-CM | POA: Diagnosis not present

## 2020-07-15 DIAGNOSIS — B356 Tinea cruris: Secondary | ICD-10-CM

## 2020-07-15 NOTE — Progress Notes (Signed)
[facility] problem visit  [facility]  Subjective:  Tanya Mckee is a 19 y.o. being seen today for   Chief Complaint  Patient presents with  . Contraception    IUD removal    19 you here requesting IUD removal. Was placed after birth of son 2018. Denies sex in last 3 years. States has not had menses in 2 years, and main reason she wants IUD removal is several week h/o vaginal odor. States her PCP is [facility], and that they would not remove her IUD, as it was not placed by them. Denies vag discharge, itching, pain, dysuria, frequency. Is moving to [place] tomorrow to live with her brother and plans to establish primary care there ASAP. Declines new BCM.  Does the patient have a current or past history of drug use? No   No components found for: HCV]   Health Maintenance Due  Topic Date Due  . Hepatitis C Screening  Never done  . COVID-19 Vaccine (1) Never done  . INFLUENZA VACCINE  03/14/2020    ROS  The following portions of the patient's history were reviewed and updated as appropriate: allergies, current medications, past family history, past medical history, past social history, past surgical history and problem list. Problem list updated.   See flowsheet for other program required questions.  Objective:   Vitals:   07/15/20 1215  BP: 131/88  Weight: 244 lb 12.8 oz (111 kg)  Height: 5\' 5"  (1.651 m)    Physical Exam Exam conducted with a chaperone present.  Constitutional:      Appearance: She is obese.  Genitourinary:    Exam position: Lithotomy position.     Pubic Area: Rash present.     Labia:        Right: Rash present.        Left: Rash present.      Urethra: No urethral pain.     Vagina: Vaginal discharge present.     Cervix: Discharge and erythema present.     Uterus: Not tender.      Adnexa: Right adnexa normal and left adnexa normal.     Rectum: No external hemorrhoid.       Comments:  Widespread, erythematous, confluent, well-demarcated rash on mons pubis, labia, inguinal and gluteal folds.  Mod amount greenish-yellow discharge in vagina/cervix. No CMT.   5cm long blue IUD strings from os. Neurological:     Mental Status: She is alert.  Psychiatric:        Mood and Affect: Mood normal.        Behavior: Behavior normal.     IUD Removal  Patient identified, informed consent performed, consent signed.  Patient was in the dorsal lithotomy position, normal external genitalia was noted.  A speculum was placed in the patient's vagina. The cervix was visualized. The strings of the IUD were grasped and pulled using ring forceps. The IUD was removed in its entirety. Patient tolerated the procedure well.    Patient will use abstinance for contraception/has no plans for pregnancy soon and she was told to avoid teratogens, take PNV and folic acid.  Routine preventative health maintenance measures emphasized.    Assessment and Plan:  Tanya Mckee is a 19 y.o. female presenting to the [facility] Department for a Women's Health problem visit  1. Family planning services IUD removed at pt request. Pt declines new BCM, plans ongoing abstinance.  2. Tinea cruris Rec  OTC topical antifungal cream per package instructions.  3. Cervicitis Pt declines STI testing. Enc to get this ASAP elsewhere.     Return if symptoms worsen or fail to improve.  Future Appointments  Date Time Provider Department Center  07/15/2020  3:20 PM Tanysha Quant, Mathis Dad, PA-C [facility] None    Landry Dyke, [place]

## 2020-07-15 NOTE — Progress Notes (Signed)
Patient here for IUD removal. States she is moving with [place] and will have PE and BCM when she gets to [place]. States no sex since 2018. Marland KitchenBurt Knack, RN

## 2020-07-29 ENCOUNTER — Emergency Department: Payer: Medicaid Other

## 2020-07-29 ENCOUNTER — Emergency Department
Admission: EM | Admit: 2020-07-29 | Discharge: 2020-07-29 | Disposition: A | Discharge status: Home / Self Care | Payer: Medicaid Other | Attending: Emergency Medicine | Admitting: Emergency Medicine

## 2020-07-29 ENCOUNTER — Other Ambulatory Visit: Payer: Self-pay

## 2020-07-29 ENCOUNTER — Encounter: Payer: Self-pay | Admitting: *Deleted

## 2020-07-29 DIAGNOSIS — Z91040 Latex allergy status: Secondary | ICD-10-CM | POA: Insufficient documentation

## 2020-07-29 DIAGNOSIS — R112 Nausea with vomiting, unspecified: Secondary | ICD-10-CM | POA: Diagnosis not present

## 2020-07-29 DIAGNOSIS — Z20822 Contact with and (suspected) exposure to covid-19: Secondary | ICD-10-CM | POA: Diagnosis not present

## 2020-07-29 DIAGNOSIS — Z7951 Long term (current) use of inhaled steroids: Secondary | ICD-10-CM | POA: Insufficient documentation

## 2020-07-29 DIAGNOSIS — R059 Cough, unspecified: Secondary | ICD-10-CM | POA: Insufficient documentation

## 2020-07-29 DIAGNOSIS — J45909 Unspecified asthma, uncomplicated: Secondary | ICD-10-CM | POA: Insufficient documentation

## 2020-07-29 LAB — POC URINE PREG, ED: Preg Test, Ur: NEGATIVE

## 2020-07-29 LAB — COMPREHENSIVE METABOLIC PANEL
ALT: 14 U/L (ref 0–44)
AST: 12 U/L — ABNORMAL LOW (ref 15–41)
Albumin: 4.1 g/dL (ref 3.5–5.0)
Alkaline Phosphatase: 59 U/L (ref 38–126)
Anion gap: 9 (ref 5–15)
BUN: 16 mg/dL (ref 6–20)
CO2: 27 mmol/L (ref 22–32)
Calcium: 9.1 mg/dL (ref 8.9–10.3)
Chloride: 104 mmol/L (ref 98–111)
Creatinine, Ser: 0.73 mg/dL (ref 0.44–1.00)
GFR, Estimated: >60 mL/min (ref >60)
Glucose, Bld: 99 mg/dL (ref 70–99)
Potassium: 3.5 mmol/L (ref 3.5–5.1)
Sodium: 140 mmol/L (ref 135–145)
Total Bilirubin: 0.6 mg/dL (ref 0.3–1.2)
Total Protein: 7.5 g/dL (ref 6.5–8.1)

## 2020-07-29 LAB — CBC
HCT: 38.4 % (ref 36.0–46.0)
Hemoglobin: 13 g/dL (ref 12.0–15.0)
MCH: 29.4 pg (ref 26.0–34.0)
MCHC: 33.9 g/dL (ref 30.0–36.0)
MCV: 86.9 fL (ref 80.0–100.0)
Platelets: 385 10*3/uL (ref 150–400)
RBC: 4.42 MIL/uL (ref 3.87–5.11)
RDW: 12 % (ref 11.5–15.5)
WBC: 6.4 10*3/uL (ref 4.0–10.5)
nRBC: 0 % (ref 0.0–0.2)

## 2020-07-29 LAB — RESP PANEL BY RT-PCR (FLU A&B, COVID) ARPGX2
Influenza A by PCR: NEGATIVE
Influenza B by PCR: NEGATIVE
SARS Coronavirus 2 by RT PCR: NEGATIVE

## 2020-07-29 LAB — LIPASE, BLOOD: Lipase: 27 U/L (ref 11–51)

## 2020-07-29 MED ORDER — ONDANSETRON 4 MG PO TBDP
4.0000 mg | ORAL_TABLET | Freq: Once | ORAL | Status: AC
  Administered 2020-07-29: 4 mg via ORAL
  Filled 2020-07-29: qty 1

## 2020-07-29 MED ORDER — ONDANSETRON 4 MG PO TBDP: 4.0000 mg | ORAL_TABLET | Freq: Three times a day (TID) | ORAL | 0 refills | Status: DC | PRN

## 2020-07-29 NOTE — ED Provider Notes (Signed)
[facility] Emergency Department Provider Note   ____________________________________________   Event Date/Time   First MD Initiated Contact with Patient 07/29/20 [phone]     (approximate)  I have reviewed the triage vital signs and the nursing notes.   HISTORY  Chief Complaint Abdominal Pain and Cough    HPI Tanya Mckee is a 19 y.o. female with past medical history of asthma, hyperlipidemia, and GERD who presents to the ED complaining of cough and vomiting.  Patient reports that she developed productive cough with thick yellow sputum about 3 days ago.  There is associated with mild dyspnea on exertion, but she denies any fevers or chest pain.  Earlier today, she developed nausea with vomiting, but she denies any diarrhea or abdominal pain.  She has not had any dysuria, hematuria, vaginal bleeding, or vaginal discharge.  She denies any sick contacts and has been vaccinated against COVID-19.        Past Medical History:  Diagnosis Date  . ADD (attention deficit disorder)   . ADHD (attention deficit hyperactivity disorder)   . Anxiety   . Asthma   . Depression   . GERD (gastroesophageal reflux disease)   . Hyperlipidemia     Patient Active Problem List   Diagnosis Date Noted  . Postpartum care following vaginal delivery 07/19/2017  . Gestational hypertension 07/16/2017  . Abdominal pain affecting pregnancy 05/29/2017  . Monilial vulvovaginitis 05/29/2017  . UTI (urinary tract infection) during pregnancy, third trimester 05/29/2017  . Dyspnea on exertion 10/20/2015  . Fatigue 10/20/2015  . Elevated blood pressure 06/16/2015  . 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 Allergic rhinitis 05/21/2015  . Asthma 05/21/2015  . GERD (gastroesophageal reflux disease) 05/21/2015  . HLD (hyperlipidemia) 05/21/2015  . Extreme obesity 05/21/2015  . Apnea, sleep 05/21/2015    Past Surgical History:  Procedure Laterality Date  . ORIF  ANKLE FRACTURE Right 02/11/2016   Procedure: OPEN REDUCTION INTERNAL FIXATION (ORIF) ANKLE FRACTURE;  Surgeon: Gwyneth Revels, DPM;  Location: [facility] ORS;  Service: Podiatry;  Laterality: Right;  . TONSILLECTOMY AND ADENOIDECTOMY  2007  . UPPER GI ENDOSCOPY  10/08/2009   Possible Esophagitis    Prior to Admission medications   Medication Sig Start Date End Date Taking? Authorizing Provider  acetaminophen (TYLENOL) 325 MG tablet Take 2 tablets (650 mg total) by mouth every 4 (four) hours as needed (for pain scale < 4). 07/21/17   Schuman, Jaquelyn Bitter, MD  albuterol (VENTOLIN HFA) 108 (90 Base) MCG/ACT inhaler Inhale 2 puffs into the lungs every 6 (six) hours as needed for wheezing or shortness of breath. Patient not taking: Reported on 07/15/2020 08/20/19   Nita Sickle, MD  budesonide-formoterol [facility]) 160-4.5 MCG/ACT inhaler Inhale 2 puffs into the lungs 2 (two) times daily. Patient not taking: Reported on 07/15/2020    [provider]  ferrous sulfate 325 (65 FE) MG EC tablet Take 325 mg by mouth daily with breakfast. Patient not taking: Reported on 07/15/2020    [provider]  labetalol (NORMODYNE) 200 MG tablet Take 1 tablet (200 mg total) by mouth 2 (two) times daily. Patient not taking: Reported on 07/15/2020 07/21/17   Natale Milch, MD  ondansetron (ZOFRAN ODT) 4 MG disintegrating tablet Take 1 tablet (4 mg total) by mouth every 8 (eight) hours as needed for nausea or vomiting. 07/29/20   Chesley Noon, MD  pantoprazole (PROTONIX) 40 MG tablet Take 1 tablet (40 mg total) by mouth daily. 11/21/17 11/21/18  Jeanmarie Plant, MD  triamcinolone cream (KENALOG) 0.1 % Apply to rash on abdomen BID prn Patient not taking: Reported on 07/15/2020 05/29/17   Farrel Conners, CNM    Allergies Aspirin, Cetirizine, Latex, and Other  Family History  Problem Relation Age of Onset  . Seizures Mother   . Stroke Mother   . Evelene Croon Parkinson White syndrome Father   .  Breast cancer Maternal Grandmother   . Lupus Maternal Grandmother   . Heart attack Maternal Grandfather   . CAD Maternal Grandfather        has stent  . Bone cancer Maternal Grandfather   . Kidney failure Paternal Grandmother   . Hypertension Paternal Grandfather   . CVA Paternal Grandfather   . Diabetes Other   . Diabetes Other   . Breast cancer Other   . Epilepsy Maternal Uncle     Social History Social History   Tobacco Use  . Smoking status: Never Smoker  . Smokeless tobacco: Never Used  Vaping Use  . Vaping Use: Never used  Substance Use Topics  . Alcohol use: No    Alcohol/week: 0.0 standard drinks  . Drug use: No    Review of Systems  Constitutional: No fever/chills Eyes: No visual changes. ENT: No sore throat. Cardiovascular: Denies chest pain. Respiratory: Positive for cough and shortness of breath. Gastrointestinal: No abdominal pain.  Positive for nausea and vomiting.  No diarrhea.  No constipation. Genitourinary: Negative for dysuria. Musculoskeletal: Negative for back pain. Skin: Negative for rash. Neurological: Negative for headaches, focal weakness or numbness.  ____________________________________________   PHYSICAL EXAM:  VITAL SIGNS: ED Triage Vitals  Enc Vitals Group     BP 07/29/20 0115 137/73     Pulse Rate 07/29/20 0115 (!) 105     Resp 07/29/20 0115 16     Temp 07/29/20 0115 97.9 F (36.6 C)     Temp Source 07/29/20 0115 Oral     SpO2 07/29/20 0115 98 %     Weight 07/29/20 0116 244 lb 11.4 oz (111 kg)     Height 07/29/20 0116 5\' 5"  (1.651 m)     Head Circumference --      Peak Flow --      Pain Score 07/29/20 0116 5     Pain Loc --      Pain Edu? --      Excl. in GC? --     Constitutional: Alert and oriented. Eyes: Conjunctivae are normal. Head: Atraumatic. Nose: No congestion/rhinnorhea. Mouth/Throat: Mucous membranes are moist. Neck: Normal ROM Cardiovascular: Normal rate, regular rhythm. Grossly normal heart  sounds. Respiratory: Normal respiratory effort.  No retractions. Lungs CTAB. Gastrointestinal: Soft and nontender. No distention. Genitourinary: deferred Musculoskeletal: No lower extremity tenderness nor edema. Neurologic:  Normal speech and language. No gross focal neurologic deficits are appreciated. Skin:  Skin is warm, dry and intact. No rash noted. Psychiatric: Mood and affect are normal. Speech and behavior are normal.  ____________________________________________   LABS (all labs ordered are listed, but only abnormal results are displayed)  Labs Reviewed  COMPREHENSIVE METABOLIC PANEL - Abnormal; Notable for the following components:      Result Value   AST 12 (*)    All other components within normal limits  RESP PANEL BY RT-PCR (FLU A&B, COVID) ARPGX2  LIPASE, BLOOD  CBC  POC URINE PREG, ED     PROCEDURES  Procedure(s) performed (including Critical Care):  Procedures   ____________________________________________   INITIAL IMPRESSION / ASSESSMENT AND PLAN /  ED COURSE       19 year old female with past medical history of asthma, hyperlipidemia, and GERD who presents to the ED complaining of 3 days of productive cough along with 24 hours of nausea and vomiting.  She denies any abdominal pain and does not have any focal abdominal tenderness on exam.  Chest x-ray reviewed by me and shows no infiltrate, edema, or effusion.  Lab work is also unremarkable.  We will treat patient's nausea with Zofran, but she has been tolerating p.o. without difficulty.  We will also perform testing for COVID-19 but patient is not in any respiratory distress and is maintaining O2 sats on room air.  Differential includes other viral bronchitis but given reassuring work-up, patient will be appropriate for discharge home with PCP follow-up.  Patient counseled to return to the ED for new worsening symptoms, patient agrees with plan.       ____________________________________________   FINAL CLINICAL IMPRESSION(S) / ED DIAGNOSES  Final diagnoses:  Suspected COVID-19 virus infection  Cough  Non-intractable vomiting with nausea, unspecified vomiting type     ED Discharge Orders         Ordered    ondansetron (ZOFRAN ODT) 4 MG disintegrating tablet  Every 8 hours PRN        07/29/20 0445           Note:  This document was prepared using Dragon voice recognition software and [place] include unintentional dictation errors.   Chesley Noon, MD 07/29/20 [phone]

## 2020-07-29 NOTE — ED Triage Notes (Addendum)
Pt to ED reporting abd pain with vomiting since yesterday with a productive cough with yellow sputum. Pt reporting she tested negative for COVID two weeks ago.

## 2020-09-13 ENCOUNTER — Ambulatory Visit (LOCAL_COMMUNITY_HEALTH_CENTER): Payer: Medicaid Other

## 2020-09-13 ENCOUNTER — Other Ambulatory Visit: Payer: Self-pay

## 2020-09-13 VITALS — BP 132/79 | Ht 65.0 in | Wt 237.5 lb

## 2020-09-13 DIAGNOSIS — Z3202 Encounter for pregnancy test, result negative: Secondary | ICD-10-CM

## 2020-09-13 LAB — PREGNANCY, URINE: Preg Test, Ur: NEGATIVE

## 2020-09-13 NOTE — Progress Notes (Signed)
UPT negative today. LMP 07/01/2020. Reports positive home preg test last week and nausea every morning. Counseled to return for preg test in 2 weeks if no period and to f-u with PCP to evaluate symptoms. Jerel Shepherd, RN

## 2020-09-24 ENCOUNTER — Emergency Department: Payer: Medicaid Other

## 2020-09-24 ENCOUNTER — Other Ambulatory Visit: Payer: Self-pay

## 2020-09-24 ENCOUNTER — Encounter: Payer: Self-pay | Admitting: Emergency Medicine

## 2020-09-24 ENCOUNTER — Emergency Department
Admission: EM | Admit: 2020-09-24 | Discharge: 2020-09-24 | Disposition: A | Discharge status: Home / Self Care | Payer: Medicaid Other | Attending: Emergency Medicine | Admitting: Emergency Medicine

## 2020-09-24 DIAGNOSIS — M25471 Effusion, right ankle: Secondary | ICD-10-CM

## 2020-09-24 DIAGNOSIS — J45909 Unspecified asthma, uncomplicated: Secondary | ICD-10-CM | POA: Diagnosis not present

## 2020-09-24 DIAGNOSIS — R2241 Localized swelling, mass and lump, right lower limb: Secondary | ICD-10-CM | POA: Diagnosis not present

## 2020-09-24 DIAGNOSIS — Z91040 Latex allergy status: Secondary | ICD-10-CM | POA: Diagnosis not present

## 2020-09-24 NOTE — ED Triage Notes (Signed)
Pt to ED via POV with c/o R ankle pain, pt states hx of fracture with surgical repair. Pt c/o swelling and pain that started yesterday, unsure if any injury. Pt noted to be ambulatory without difficulty to triage room. Pt with noted swelling to R ankle/foot at this time.

## 2020-09-24 NOTE — ED Notes (Signed)
See triage note  Presents with pain to right ankle  Denies any known injury  No deformity noted  Good pulses  Ambulates well

## 2020-09-24 NOTE — ED Provider Notes (Signed)
[facility] Emergency Department Provider Note ____________________________________________  Time seen: 1220  I have reviewed the triage vital signs and the nursing notes.  HISTORY  Chief Complaint  Ankle Pain   HPI Tanya Mckee is a 20 y.o. female presents to the ER today with complaint of right ankle swelling. She reports she noticed this at 1 AM this morning. She denies any pain, numbness, tingling or weakness. She has not noticed any redness or warmth with the swelling. She reports history of right ankle fracture with surgical repair with hardware placement in 2018. She has not taken anything OTC for her symptoms.  Past Medical History:  Diagnosis Date  . ADD (attention deficit disorder)   . ADHD (attention deficit hyperactivity disorder)   . Anxiety   . Asthma   . Depression   . GERD (gastroesophageal reflux disease)   . Hyperlipidemia     Patient Active Problem List   Diagnosis Date Noted  . Postpartum care following vaginal delivery 07/19/2017  . Gestational hypertension 07/16/2017  . Abdominal pain affecting pregnancy 05/29/2017  . Monilial vulvovaginitis 05/29/2017  . UTI (urinary tract infection) during pregnancy, third trimester 05/29/2017  . Dyspnea on exertion 10/20/2015  . Fatigue 10/20/2015  . Elevated blood pressure 06/16/2015  . 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 Allergic rhinitis 05/21/2015  . Asthma 05/21/2015  . GERD (gastroesophageal reflux disease) 05/21/2015  . HLD (hyperlipidemia) 05/21/2015  . Extreme obesity 05/21/2015  . Apnea, sleep 05/21/2015    Past Surgical History:  Procedure Laterality Date  . ORIF ANKLE FRACTURE Right 02/11/2016   Procedure: OPEN REDUCTION INTERNAL FIXATION (ORIF) ANKLE FRACTURE;  Surgeon: Gwyneth Revels, DPM;  Location: [facility] ORS;  Service: Podiatry;  Laterality: Right;  . TONSILLECTOMY AND ADENOIDECTOMY  2007  . UPPER GI ENDOSCOPY  10/08/2009   Possible Esophagitis     Prior to Admission medications   Medication Sig Start Date End Date Taking? Authorizing Provider  acetaminophen (TYLENOL) 325 MG tablet Take 2 tablets (650 mg total) by mouth every 4 (four) hours as needed (for pain scale < 4). 07/21/17   Schuman, Jaquelyn Bitter, MD  pantoprazole (PROTONIX) 40 MG tablet Take 1 tablet (40 mg total) by mouth daily. 11/21/17 11/21/18  Jeanmarie Plant, MD  albuterol (VENTOLIN HFA) 108 (90 Base) MCG/ACT inhaler Inhale 2 puffs into the lungs every 6 (six) hours as needed for wheezing or shortness of breath. Patient not taking: Reported on 07/15/2020 08/20/19 09/24/20  Nita Sickle, MD  budesonide-formoterol [facility]) 160-4.5 MCG/ACT inhaler Inhale 2 puffs into the lungs 2 (two) times daily. Patient not taking: Reported on 07/15/2020  09/24/20  [provider]  ferrous sulfate 325 (65 FE) MG EC tablet Take 325 mg by mouth daily with breakfast. Patient not taking: Reported on 07/15/2020  09/24/20  [provider]  labetalol (NORMODYNE) 200 MG tablet Take 1 tablet (200 mg total) by mouth 2 (two) times daily. Patient not taking: Reported on 07/15/2020 07/21/17 09/24/20  Natale Milch, MD    Allergies Aspirin, Cetirizine, Latex, and Other  Family History  Problem Relation Age of Onset  . Seizures Mother   . Stroke Mother   . Evelene Croon Parkinson White syndrome Father   . Breast cancer Maternal Grandmother   . Lupus Maternal Grandmother   . Heart attack Maternal Grandfather   . CAD Maternal Grandfather        has stent  . Bone cancer Maternal Grandfather   . Kidney failure Paternal Grandmother   .  Hypertension Paternal Grandfather   . CVA Paternal Grandfather   . Diabetes Other   . Diabetes Other   . Breast cancer Other   . Epilepsy Maternal Uncle     Social History Social History   Tobacco Use  . Smoking status: Never Smoker  . Smokeless tobacco: Never Used  Vaping Use  . Vaping Use: Never used  Substance Use Topics  . Alcohol  use: No    Alcohol/week: 0.0 standard drinks  . Drug use: No    Review of Systems  Constitutional: Negative for fever, chills or body aches. Cardiovascular: Negative for chest pain or chest tightness. Respiratory: Negative for cough or shortness of breath. Musculoskeletal: Negative for back pain, hip, knee or ankle pain. Positive for right ankle swelling. Skin: Negative for redness of the right ankle. Neurological: Negative for focal weakness, tingling or numbness. ____________________________________________  PHYSICAL EXAM:  VITAL SIGNS: ED Triage Vitals  Enc Vitals Group     BP 09/24/20 1206 138/72     Pulse Rate 09/24/20 1206 91     Resp 09/24/20 1206 18     Temp 09/24/20 1206 98.4 F (36.9 C)     Temp Source 09/24/20 1206 Oral     SpO2 09/24/20 1206 97 %     Weight 09/24/20 1204 230 lb (104.3 kg)     Height 09/24/20 1204 5\' 5"  (1.651 m)     Head Circumference --      Peak Flow --      Pain Score 09/24/20 1204 5     Pain Loc --      Pain Edu? --      Excl. in GC? --     Constitutional: Alert and oriented. Obese, in no distress. Cardiovascular: Normal rate, regular rhythm. Pedal pulses 2+ bilaterally Respiratory: Normal respiratory effort. No wheezes/rales/rhonchi. Musculoskeletal: Normal flexion, extension and rotation of the right ankle. 1+ swelling noted over the right lateral malleoli. Pain with palpation over the right lateral malleoli. Limping gait. Neurologic:  . Normal speech and language. No gross focal neurologic deficits are appreciated. Skin:  Skin is warm, dry and intact. No redness or warmth noted. ____________________________________________   RADIOLOGY  Imaging Orders     DG Ankle Complete Right  ____________________________________________    INITIAL IMPRESSION / ASSESSMENT AND PLAN / ED COURSE  Right Ankle Swelling:  Xray right ankle negative for acute findings ACE wrap applied Encouraged ice, elevation and Ibuprofen 600 mg TID prn with  food ____________________________________________  FINAL CLINICAL IMPRESSION(S) / ED DIAGNOSES  Final diagnoses:  Right ankle swelling      11/22/20, NP 09/24/20 1245    11/22/20, MD 09/24/20 1622

## 2020-09-24 NOTE — ED Notes (Signed)
First Nurse Note:  Pt to ED via POV for right leg pain. Pt states that she broke it several years ago. Pt is in NAD.

## 2020-09-24 NOTE — Discharge Instructions (Addendum)
You were seen today for right ankle swelling.  The x-ray of your ankle showed no acute findings.  This is likely due to prior fracture and surgical repair.  We have provided with an Ace wrap to help reduce swelling.  You Cromie elevate your leg, apply ice for 10 minutes twice daily and take ibuprofen OTC as needed for pain and inflammation.  Please follow-up with your PCP if symptoms persist or worsen.

## 2020-10-30 ENCOUNTER — Emergency Department: Payer: Medicaid Other

## 2020-10-30 ENCOUNTER — Encounter: Payer: Self-pay | Admitting: Emergency Medicine

## 2020-10-30 ENCOUNTER — Emergency Department
Admission: EM | Admit: 2020-10-30 | Discharge: 2020-10-30 | Disposition: A | Discharge status: Home / Self Care | Payer: Medicaid Other | Attending: Emergency Medicine | Admitting: Emergency Medicine

## 2020-10-30 ENCOUNTER — Other Ambulatory Visit: Payer: Self-pay

## 2020-10-30 DIAGNOSIS — Z3201 Encounter for pregnancy test, result positive: Secondary | ICD-10-CM | POA: Insufficient documentation

## 2020-10-30 DIAGNOSIS — R102 Pelvic and perineal pain: Secondary | ICD-10-CM | POA: Diagnosis not present

## 2020-10-30 DIAGNOSIS — R112 Nausea with vomiting, unspecified: Secondary | ICD-10-CM | POA: Insufficient documentation

## 2020-10-30 DIAGNOSIS — N939 Abnormal uterine and vaginal bleeding, unspecified: Secondary | ICD-10-CM | POA: Diagnosis not present

## 2020-10-30 DIAGNOSIS — J45909 Unspecified asthma, uncomplicated: Secondary | ICD-10-CM | POA: Diagnosis not present

## 2020-10-30 DIAGNOSIS — Z91040 Latex allergy status: Secondary | ICD-10-CM | POA: Insufficient documentation

## 2020-10-30 LAB — CBC WITH DIFFERENTIAL/PLATELET
Abs Immature Granulocytes: 0.01 10*3/uL (ref 0.00–0.07)
Basophils Absolute: 0 10*3/uL (ref 0.0–0.1)
Basophils Relative: 1 %
Eosinophils Absolute: 0.1 10*3/uL (ref 0.0–0.5)
Eosinophils Relative: 1 %
HCT: 37.4 % (ref 36.0–46.0)
Hemoglobin: 12.3 g/dL (ref 12.0–15.0)
Immature Granulocytes: 0 %
Lymphocytes Relative: 14 %
Lymphs Abs: 1.1 10*3/uL (ref 0.7–4.0)
MCH: 29.3 pg (ref 26.0–34.0)
MCHC: 32.9 g/dL (ref 30.0–36.0)
MCV: 89 fL (ref 80.0–100.0)
Monocytes Absolute: 0.5 10*3/uL (ref 0.1–1.0)
Monocytes Relative: 6 %
Neutro Abs: 5.9 10*3/uL (ref 1.7–7.7)
Neutrophils Relative %: 78 %
Platelets: 386 10*3/uL (ref 150–400)
RBC: 4.2 MIL/uL (ref 3.87–5.11)
RDW: 12.3 % (ref 11.5–15.5)
WBC: 7.7 10*3/uL (ref 4.0–10.5)
nRBC: 0 % (ref 0.0–0.2)

## 2020-10-30 LAB — BASIC METABOLIC PANEL
Anion gap: 6 (ref 5–15)
BUN: 18 mg/dL (ref 6–20)
CO2: 25 mmol/L (ref 22–32)
Calcium: 9 mg/dL (ref 8.9–10.3)
Chloride: 109 mmol/L (ref 98–111)
Creatinine, Ser: 0.54 mg/dL (ref 0.44–1.00)
GFR, Estimated: >60 mL/min (ref >60)
Glucose, Bld: 97 mg/dL (ref 70–99)
Potassium: 3.5 mmol/L (ref 3.5–5.1)
Sodium: 140 mmol/L (ref 135–145)

## 2020-10-30 LAB — HCG, QUANTITATIVE, PREGNANCY: hCG, Beta Chain, Quant, S: <1 m[IU]/mL (ref <5)

## 2020-10-30 LAB — ABO/RH: ABO/RH(D): A NEG

## 2020-10-30 MED ORDER — ACETAMINOPHEN 500 MG PO TABS
1000.0000 mg | ORAL_TABLET | Freq: Once | ORAL | Status: AC
  Administered 2020-10-30: 1000 mg via ORAL
  Filled 2020-10-30: qty 2

## 2020-10-30 NOTE — ED Triage Notes (Signed)
Pt to ED via POV stating that she had a positive at home pregnancy test 1 week ago. Pt states that today she started having vaginal bleeding and sharp pain in her lower abdomen. Pt is in NAD. Pt states that she has used 2 pads since 12. Pt states that she is passing clots.

## 2020-10-30 NOTE — ED Provider Notes (Signed)
[facility] Emergency Department Provider Note  ____________________________________________   Event Date/Time   First MD Initiated Contact with Patient 10/30/20 1802     (approximate)  I have reviewed the triage vital signs and the nursing notes.   HISTORY  Chief Complaint Vaginal Bleeding   HPI Tanya Mckee is a 20 y.o. female with a past medical history of ADHD, anxiety, asthma, depression, GERD, HDL and reported home positive pregnancy test about a week ago presents for assessment of some crampy lower abdominal pain that began today associate with passage of some blood clots.  Patient dates he thinks her bleeding slowed down a bit little bit.  She has not taken any medications prior to arrival.  She versus some nausea and vomiting over the last couple of days but none today.  She states she is not currently nauseous.  She denies any headache, earache, sore throat, chest pain, cough, shortness of breath, back pain, pain with urination, blood in her urine, rash or extremity pain.  No recent falls or injuries.  Denies EtOH use illicit drug use or tobacco use.  Denies any other acute concerns at this time.  States she has not seen OB/GYN for this pregnancy.         Past Medical History:  Diagnosis Date  . ADD (attention deficit disorder)   . ADHD (attention deficit hyperactivity disorder)   . Anxiety   . Asthma   . Depression   . GERD (gastroesophageal reflux disease)   . Hyperlipidemia     Patient Active Problem List   Diagnosis Date Noted  . Postpartum care following vaginal delivery 07/19/2017  . Gestational hypertension 07/16/2017  . Abdominal pain affecting pregnancy 05/29/2017  . Monilial vulvovaginitis 05/29/2017  . UTI (urinary tract infection) during pregnancy, third trimester 05/29/2017  . Dyspnea on exertion 10/20/2015  . Fatigue 10/20/2015  . Elevated blood pressure 06/16/2015  . Attention deficit disorder with hyperactivity  05/21/2015  . Allergic rhinitis 05/21/2015  . Asthma 05/21/2015  . GERD (gastroesophageal reflux disease) 05/21/2015  . HLD (hyperlipidemia) 05/21/2015  . Extreme obesity 05/21/2015  . Apnea, sleep 05/21/2015    Past Surgical History:  Procedure Laterality Date  . ORIF ANKLE FRACTURE Right 02/11/2016   Procedure: OPEN REDUCTION INTERNAL FIXATION (ORIF) ANKLE FRACTURE;  Surgeon: Gwyneth Revels, DPM;  Location: [facility] ORS;  Service: Podiatry;  Laterality: Right;  . TONSILLECTOMY AND ADENOIDECTOMY  2007  . UPPER GI ENDOSCOPY  10/08/2009   Possible Esophagitis    Prior to Admission medications   Medication Sig Start Date End Date Taking? Authorizing Provider  acetaminophen (TYLENOL) 325 MG tablet Take 2 tablets (650 mg total) by mouth every 4 (four) hours as needed (for pain scale < 4). 07/21/17   Schuman, Jaquelyn Bitter, MD  pantoprazole (PROTONIX) 40 MG tablet Take 1 tablet (40 mg total) by mouth daily. 11/21/17 11/21/18  Jeanmarie Plant, MD  albuterol (VENTOLIN HFA) 108 (90 Base) MCG/ACT inhaler Inhale 2 puffs into the lungs every 6 (six) hours as needed for wheezing or shortness of breath. Patient not taking: Reported on 07/15/2020 08/20/19 09/24/20  Nita Sickle, MD  budesonide-formoterol [facility]) 160-4.5 MCG/ACT inhaler Inhale 2 puffs into the lungs 2 (two) times daily. Patient not taking: Reported on 07/15/2020  09/24/20  [provider]  ferrous sulfate 325 (65 FE) MG EC tablet Take 325 mg by mouth daily with breakfast. Patient not taking: Reported on 07/15/2020  09/24/20  [provider]  labetalol (NORMODYNE) 200 MG  tablet Take 1 tablet (200 mg total) by mouth 2 (two) times daily. Patient not taking: Reported on 07/15/2020 07/21/17 09/24/20  Natale Milch, MD    Allergies Aspirin, Cetirizine, Latex, and Other  Family History  Problem Relation Age of Onset  . Seizures Mother   . Stroke Mother   . Evelene Croon Parkinson White syndrome Father   . Breast cancer  Maternal Grandmother   . Lupus Maternal Grandmother   . Heart attack Maternal Grandfather   . CAD Maternal Grandfather        has stent  . Bone cancer Maternal Grandfather   . Kidney failure Paternal Grandmother   . Hypertension Paternal Grandfather   . CVA Paternal Grandfather   . Diabetes Other   . Diabetes Other   . Breast cancer Other   . Epilepsy Maternal Uncle     Social History Social History   Tobacco Use  . Smoking status: Never Smoker  . Smokeless tobacco: Never Used  Vaping Use  . Vaping Use: Never used  Substance Use Topics  . Alcohol use: No    Alcohol/week: 0.0 standard drinks  . Drug use: No    Review of Systems  Review of Systems  Constitutional: Negative for chills and fever.  HENT: Negative for sore throat.   Eyes: Negative for pain.  Respiratory: Negative for cough and stridor.   Cardiovascular: Negative for chest pain.  Gastrointestinal: Positive for abdominal pain, nausea and vomiting.  Genitourinary: Negative for dysuria.  Musculoskeletal: Negative for myalgias.  Skin: Negative for rash.  Neurological: Negative for seizures, loss of consciousness and headaches.  Psychiatric/Behavioral: Negative for suicidal ideas.  All other systems reviewed and are negative.     ____________________________________________   PHYSICAL EXAM:  VITAL SIGNS: ED Triage Vitals  Enc Vitals Group     BP 10/30/20 1616 125/79     Pulse Rate 10/30/20 1616 100     Resp 10/30/20 1616 16     Temp 10/30/20 1616 99.1 F (37.3 C)     Temp Source 10/30/20 1616 Oral     SpO2 10/30/20 1616 98 %     Weight 10/30/20 1614 245 lb (111.1 kg)     Height 10/30/20 1614 5\' 5"  (1.651 m)     Head Circumference --      Peak Flow --      Pain Score 10/30/20 1613 5     Pain Loc --      Pain Edu? --      Excl. in GC? --    Vitals:   10/30/20 1616  BP: 125/79  Pulse: 100  Resp: 16  Temp: 99.1 F (37.3 C)  SpO2: 98%   Physical Exam Vitals and nursing note reviewed.   Constitutional:      General: She is not in acute distress.    Appearance: She is well-developed.  HENT:     Head: Normocephalic and atraumatic.     Right Ear: External ear normal.     Left Ear: External ear normal.     Nose: Nose normal.  Eyes:     Conjunctiva/sclera: Conjunctivae normal.  Cardiovascular:     Rate and Rhythm: Normal rate and regular rhythm.     Heart sounds: No murmur heard.   Pulmonary:     Effort: Pulmonary effort is normal. No respiratory distress.     Breath sounds: Normal breath sounds.  Abdominal:     Palpations: Abdomen is soft.     Tenderness: There is no abdominal  tenderness. There is no right CVA tenderness or left CVA tenderness.  Musculoskeletal:     Cervical back: Neck supple.  Skin:    General: Skin is warm and dry.     Capillary Refill: Capillary refill takes less than 2 seconds.  Neurological:     Mental Status: She is alert and oriented to person, place, and time.  Psychiatric:        Mood and Affect: Mood normal.      ____________________________________________   LABS (all labs ordered are listed, but only abnormal results are displayed)  Labs Reviewed  CBC WITH DIFFERENTIAL/PLATELET  HCG, QUANTITATIVE, PREGNANCY  BASIC METABOLIC PANEL  ABO/RH   ____________________________________________  EKG  ____________________________________________  RADIOLOGY  ED MD interpretation: No evidence of torsion or other acute pelvic abnormality.  There is right-sided follicle.  Official radiology report(s): US PELVIC COMPLETE W TRANSVAGINAL AND TORSION R/O  Result Date: 10/30/2020 CLINICAL DATA:  Initial evaluation for acute right pelvic pain, vaginal bleeding. EXAM: TRANSABDOMINAL AND TRANSVAGINAL ULTRASOUND OF PELVIS DOPPLER ULTRASOUND OF OVARIES TECHNIQUE: Both transabdominal and transvaginal ultrasound examinations of the pelvis were performed. Transabdominal technique was performed for global imaging of the pelvis including uterus,  ovaries, adnexal regions, and pelvic cul-de-sac. It was necessary to proceed with endovaginal exam following the transabdominal exam to visualize the uterus, endometrium, and ovaries. Color and duplex Doppler ultrasound was utilized to evaluate blood flow to the ovaries. COMPARISON:  None. FINDINGS: Uterus Measurements: 8.3 x 3.7 x 5.2 cm = volume: 84.5 mL. Increased vascularity noted throughout the uterine myometrium. Myometrial echotexture otherwise homogeneous and within normal limits. No discrete fibroid or other mass. Endometrium Thickness: 15 mm.  No focal abnormality visualized. Right ovary Measurements: 3.4 x 2.4 x 2.8 cm = volume: 12.2 mL. 2.8 x 2.8 x 1.9 cm dominant follicle noted. No other adnexal mass. Left ovary Not visualized.  No adnexal mass. Pulsed Doppler evaluation of the right ovary demonstrates normal low-resistance arterial and venous waveforms. Other findings No abnormal free fluid. IMPRESSION: 1. 2.8 cm dominant right ovarian follicle. No evidence for right ovarian torsion. 2. Nonvisualization of the left ovary. No other adnexal mass or free fluid within the pelvis. 3. No other acute abnormality. Electronically Signed   By: Rise Mu M.D.   On: 10/30/2020 19:05    ____________________________________________   PROCEDURES  Procedure(s) performed (including Critical Care):  Procedures   ____________________________________________   INITIAL IMPRESSION / ASSESSMENT AND PLAN / ED COURSE     Patient presents above-stated exam for assessment of some lower abdominal pain vaginal bleeding with passage of some blood clots in the setting of home pregnancy test between a week and 2 weeks ago.  On arrival she is afebrile hemodynamically stable.  Her abdomen is soft nontender throughout and there is no CVA tenderness.  Suspect patient Wilden have had a miscarriage versus erroneous on pregnancy test and some abnormal uterine bleeding..  She denies any symptoms of symptomatic  anemia i.e. chest pain, shortness of breath, dizziness lightheadedness or vision changes.  Her CBC shows no acute anemia or leukocytosis.  hCG today is undetectable and not consistent with any pregnancy at this time.  Ultrasound obtained remarkable for no evidence of torsion or other pelvic abnormality.  No evidence of retained products of conception.  No evidence of abscess.  Given patient denies any abnormal discharge or pain urination unless patient began factious process.  BMP shows no significant electrolyte or metabolic derangements.  Impression is abnormal uterine bleeding versus early miscarriage.  On reassessment patient states she felt little better.  Given stable vitals, reassuring exam hemoglobin ultrasound ablation safe for discharge with plan for outpatient OB follow-up.  Discharged stable condition.  Strict return cautions advised and discussed         ____________________________________________   FINAL CLINICAL IMPRESSION(S) / ED DIAGNOSES  Final diagnoses:  Pelvic pain  Vaginal bleeding    Medications  acetaminophen (TYLENOL) tablet 1,000 mg (1,000 mg Oral Given 10/30/20 1811)     ED Discharge Orders    None       Note:  This document was prepared using Dragon voice recognition software and Siwe[place] include unintentional dictation errors.   Gilles ChiquitoSmith, Alina Gilkey P, MD 10/30/20 1949

## 2020-11-07 ENCOUNTER — Emergency Department: Payer: Medicaid Other

## 2020-11-07 ENCOUNTER — Other Ambulatory Visit: Payer: Self-pay

## 2020-11-07 ENCOUNTER — Emergency Department
Admission: EM | Admit: 2020-11-07 | Discharge: 2020-11-07 | Disposition: A | Discharge status: Home / Self Care | Payer: Medicaid Other | Attending: Emergency Medicine | Admitting: Emergency Medicine

## 2020-11-07 DIAGNOSIS — W06XXXA Fall from bed, initial encounter: Secondary | ICD-10-CM | POA: Diagnosis not present

## 2020-11-07 DIAGNOSIS — M545 Low back pain, unspecified: Secondary | ICD-10-CM | POA: Diagnosis not present

## 2020-11-07 DIAGNOSIS — J45909 Unspecified asthma, uncomplicated: Secondary | ICD-10-CM | POA: Insufficient documentation

## 2020-11-07 DIAGNOSIS — M25552 Pain in left hip: Secondary | ICD-10-CM | POA: Diagnosis not present

## 2020-11-07 DIAGNOSIS — Y9289 Other specified places as the place of occurrence of the external cause: Secondary | ICD-10-CM | POA: Insufficient documentation

## 2020-11-07 DIAGNOSIS — Z91040 Latex allergy status: Secondary | ICD-10-CM | POA: Diagnosis not present

## 2020-11-07 LAB — POC URINE PREG, ED: Preg Test, Ur: NEGATIVE

## 2020-11-07 MED ORDER — DEXAMETHASONE 1 MG/ML PO CONC
10.0000 mg | Freq: Once | ORAL | Status: AC
  Administered 2020-11-07: 10 mg via ORAL
  Filled 2020-11-07 (×3): qty 10

## 2020-11-07 MED ORDER — METHOCARBAMOL 750 MG PO TABS: 750.0000 mg | ORAL_TABLET | Freq: Four times a day (QID) | ORAL | 0 refills | Status: AC | PRN

## 2020-11-07 MED ORDER — ACETAMINOPHEN 325 MG PO TABS
650.0000 mg | ORAL_TABLET | Freq: Once | ORAL | Status: AC
  Administered 2020-11-07: 650 mg via ORAL
  Filled 2020-11-07: qty 2

## 2020-11-07 MED ORDER — METHOCARBAMOL 500 MG PO TABS
750.0000 mg | ORAL_TABLET | Freq: Once | ORAL | Status: AC
  Administered 2020-11-07: 750 mg via ORAL
  Filled 2020-11-07: qty 2

## 2020-11-07 NOTE — ED Triage Notes (Signed)
Pt comes pov with left hip and back pain after tripping yesterday. Ambulatory to triage.

## 2020-11-07 NOTE — Discharge Instructions (Addendum)
Take muscle relaxant as prescribed.  Take Tylenol, up to 1000 mg 4 times daily in addition as needed for pain.  Follow-up with primary care as needed.

## 2020-11-07 NOTE — ED Notes (Signed)
Discharge delay -- awaiting Decadron to be sent from pharm -- provider C Rogers notified via secure chat

## 2020-11-07 NOTE — ED Notes (Signed)
PO Decadron received via tube system however 2 cc of med had wasted in bag --notified Kayla in pharmacy and pharmacy will replace dose originally sent

## 2020-11-07 NOTE — ED Provider Notes (Signed)
[facility] Emergency Department Provider Note  ____________________________________________   Event Date/Time   First MD Initiated Contact with Patient 11/07/20 2019     (approximate)  I have reviewed the triage vital signs and the nursing notes.   HISTORY  Chief Complaint Hip Pain  HPI Tanya Mckee is a 20 y.o. female who presents to the ER for evaluation of low back and left hip pain. Patient states she fell out of bed last night and rolled onto the floor. She states pain is mostly lateral aspect of left hip. She states she has been able to ambulate today but with increased pain - states she ambulated from [place] to here. She reports her pain is a 10/10 but has not attempted any alleviating measures. She denies any fever, abdominal pain, dysuria, loss of bowel or bladder control, or weakness down her leg.          Past Medical History:  Diagnosis Date  . ADD (attention deficit disorder)   . ADHD (attention deficit hyperactivity disorder)   . Anxiety   . Asthma   . Depression   . GERD (gastroesophageal reflux disease)   . Hyperlipidemia     Patient Active Problem List   Diagnosis Date Noted  . Postpartum care following vaginal delivery 07/19/2017  . Gestational hypertension 07/16/2017  . Abdominal pain affecting pregnancy 05/29/2017  . Monilial vulvovaginitis 05/29/2017  . UTI (urinary tract infection) during pregnancy, third trimester 05/29/2017  . Dyspnea on exertion 10/20/2015  . Fatigue 10/20/2015  . Elevated blood pressure 06/16/2015  . 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 Allergic rhinitis 05/21/2015  . Asthma 05/21/2015  . GERD (gastroesophageal reflux disease) 05/21/2015  . HLD (hyperlipidemia) 05/21/2015  . Extreme obesity 05/21/2015  . Apnea, sleep 05/21/2015    Past Surgical History:  Procedure Laterality Date  . ORIF ANKLE FRACTURE Right 02/11/2016   Procedure: OPEN REDUCTION INTERNAL FIXATION  (ORIF) ANKLE FRACTURE;  Surgeon: Gwyneth Revels, DPM;  Location: [facility] ORS;  Service: Podiatry;  Laterality: Right;  . TONSILLECTOMY AND ADENOIDECTOMY  2007  . UPPER GI ENDOSCOPY  10/08/2009   Possible Esophagitis    Prior to Admission medications   Medication Sig Start Date End Date Taking? Authorizing Provider  methocarbamol (ROBAXIN-750) 750 MG tablet Take 1 tablet (750 mg total) by mouth 4 (four) times daily as needed for up to 10 days for muscle spasms. 11/07/20 11/17/20 Yes Lucy Chris, PA  acetaminophen (TYLENOL) 325 MG tablet Take 2 tablets (650 mg total) by mouth every 4 (four) hours as needed (for pain scale < 4). 07/21/17   Schuman, Jaquelyn Bitter, MD  pantoprazole (PROTONIX) 40 MG tablet Take 1 tablet (40 mg total) by mouth daily. 11/21/17 11/21/18  Jeanmarie Plant, MD  albuterol (VENTOLIN HFA) 108 (90 Base) MCG/ACT inhaler Inhale 2 puffs into the lungs every 6 (six) hours as needed for wheezing or shortness of breath. Patient not taking: Reported on 07/15/2020 08/20/19 09/24/20  Nita Sickle, MD  budesonide-formoterol [facility]) 160-4.5 MCG/ACT inhaler Inhale 2 puffs into the lungs 2 (two) times daily. Patient not taking: Reported on 07/15/2020  09/24/20  [provider]  ferrous sulfate 325 (65 FE) MG EC tablet Take 325 mg by mouth daily with breakfast. Patient not taking: Reported on 07/15/2020  09/24/20  [provider]  labetalol (NORMODYNE) 200 MG tablet Take 1 tablet (200 mg total) by mouth 2 (two) times daily. Patient not taking: Reported on 07/15/2020 07/21/17 09/24/20  Adelene Idler  R, MD    Allergies Aspirin, Cetirizine, Latex, and Other  Family History  Problem Relation Age of Onset  . Seizures Mother   . Stroke Mother   . Evelene Croon Parkinson White syndrome Father   . Breast cancer Maternal Grandmother   . Lupus Maternal Grandmother   . Heart attack Maternal Grandfather   . CAD Maternal Grandfather        has stent  . Bone cancer Maternal  Grandfather   . Kidney failure Paternal Grandmother   . Hypertension Paternal Grandfather   . CVA Paternal Grandfather   . Diabetes Other   . Diabetes Other   . Breast cancer Other   . Epilepsy Maternal Uncle     Social History Social History   Tobacco Use  . Smoking status: Never Smoker  . Smokeless tobacco: Never Used  Vaping Use  . Vaping Use: Never used  Substance Use Topics  . Alcohol use: No    Alcohol/week: 0.0 standard drinks  . Drug use: No    Review of Systems Constitutional: No fever/chills Eyes: No visual changes. ENT: No sore throat. Cardiovascular: Denies chest pain. Respiratory: Denies shortness of breath. Gastrointestinal: No abdominal pain.  No nausea, no vomiting.  No diarrhea.  No constipation. Genitourinary: Negative for dysuria. Musculoskeletal: + left hip pain and + low back pain Skin: Negative for rash. Neurological: Negative for headaches, focal weakness or numbness.   ____________________________________________   PHYSICAL EXAM:  VITAL SIGNS: ED Triage Vitals  Enc Vitals Group     BP 11/07/20 1954 (!) 144/84     Pulse Rate 11/07/20 1954 81     Resp 11/07/20 1954 18     Temp 11/07/20 1954 99.2 F (37.3 C)     Temp Source 11/07/20 1954 Oral     SpO2 11/07/20 1954 100 %     Weight 11/07/20 1955 235 lb (106.6 kg)     Height 11/07/20 1955 5\' 5"  (1.651 m)     Head Circumference --      Peak Flow --      Pain Score 11/07/20 1954 8     Pain Loc --      Pain Edu? --      Excl. in GC? --    Constitutional: Alert and oriented. Well appearing and in no acute distress. Eyes: Conjunctivae are normal. PERRL. EOMI. Head: Atraumatic. Nose: No congestion/rhinnorhea. Neck: No stridor.  No tenderness to the midline or paraspinals of the cervical spine. Cardiovascular: Normal rate, regular rhythm. Grossly normal heart sounds.  Good peripheral circulation. Respiratory: Normal respiratory effort.  No retractions. Lungs CTAB. Musculoskeletal: There  is tenderness to palpation of the left SI joint region, left hip in the lateral region.  Negative logroll of the bilateral legs.  Full range of motion of left hip without difficulty.  Dorsal pedal pulses 2+, capillary refill less than 3 seconds. Neurologic:  Normal speech and language. No gross focal neurologic deficits are appreciated. No gait instability. Skin:  Skin is warm, dry and intact. No rash noted. Psychiatric: Mood and affect are normal. Speech and behavior are normal.  ____________________________________________   LABS (all labs ordered are listed, but only abnormal results are displayed)  Labs Reviewed  POC URINE PREG, ED   ____________________________________________  RADIOLOGY I, 11/09/20, personally viewed and evaluated these images (plain radiographs) as part of my medical decision making, as well as reviewing the written report by the radiologist.  ED provider interpretation: No acute fracture identified of the hip  or low back  Official radiology report(s): DG Lumbar Spine 2-3 Views  Result Date: 11/07/2020 CLINICAL DATA:  Low back pain EXAM: LUMBAR SPINE - 3 VIEW COMPARISON:  None. FINDINGS: Five lumbar type vertebral bodies are well visualized. Vertebral body height is well maintained. No anterolisthesis is seen. No soft tissue abnormality is noted. IMPRESSION: No acute abnormality noted. Electronically Signed   By: Alcide Clever M.D.   On: 11/07/2020 21:47   DG Hip Unilat W or Wo Pelvis 2-3 Views Left  Result Date: 11/07/2020 CLINICAL DATA:  Recent fall with left hip pain, initial encounter EXAM: DG HIP (WITH OR WITHOUT PELVIS) 3V LEFT COMPARISON:  None. FINDINGS: Pelvic ring is intact. No acute fracture or dislocation is noted. No soft tissue abnormality is seen. IMPRESSION: No acute abnormality noted. Electronically Signed   By: Alcide Clever M.D.   On: 11/07/2020 21:45   ____________________________________________   INITIAL IMPRESSION / ASSESSMENT AND  PLAN / ED COURSE  As part of my medical decision making, I reviewed the following data within the electronic MEDICAL RECORD NUMBER Nursing notes reviewed and incorporated, Radiograph reviewed and Notes from prior ED visits        Patient is a 20 year old female who presents to the emergency department for evaluation of left hip and low back pain after a fall last night.  See HPI for further details.  In triage, the patient is mildly hypertensive otherwise has normal vital signs.  On physical exam, patient does have tenderness of the left SI joint region as well as the left lateral hip, otherwise exam is grossly within normal limits.  She does not have any focal neuro deficits.  X-ray was obtained of the lumbar spine and left hip, and are negative for acute fracture.  Given the reassuring exam, also given the patient walked to our facility from [place] as well as her negative x-rays, feel that this is most likely musculoskeletal pain related to her fall.  Will initiate treatment with anti-inflammatory and muscle relaxant in addition to over-the-counter Tylenol.  Return precautions were discussed, patient is stable at time for outpatient follow-up.      ____________________________________________   FINAL CLINICAL IMPRESSION(S) / ED DIAGNOSES  Final diagnoses:  Left hip pain  Acute left-sided low back pain without sciatica     ED Discharge Orders         Ordered    methocarbamol (ROBAXIN-750) 750 MG tablet  4 times daily PRN        11/07/20 2157          *Please note:  Tanya Mckee was evaluated in Emergency Department on 11/08/2020 for the symptoms described in the history of present illness. She was evaluated in the context of the global COVID-19 pandemic, which necessitated consideration that the patient might be at risk for infection with the SARS-CoV-2 virus that causes COVID-19. Institutional protocols and algorithms that pertain to the evaluation of patients at risk for COVID-19 are in  a state of rapid change based on information released by regulatory bodies including the CDC and federal and state organizations. These policies and algorithms were followed during the patient's care in the ED.  Some ED evaluations and interventions [place] be delayed as a result of limited staffing during and the pandemic.*   Note:  This document was prepared using Dragon voice recognition software and [place] include unintentional dictation errors.   Lucy Chris, PA 11/08/20 1834    Concha Se, MD 11/10/20 0700

## 2020-12-09 ENCOUNTER — Other Ambulatory Visit: Payer: Self-pay

## 2020-12-09 ENCOUNTER — Emergency Department
Admission: EM | Admit: 2020-12-09 | Discharge: 2020-12-09 | Disposition: A | Discharge status: Home / Self Care | Payer: Medicaid Other | Attending: Emergency Medicine | Admitting: Emergency Medicine

## 2020-12-09 DIAGNOSIS — Z91040 Latex allergy status: Secondary | ICD-10-CM | POA: Diagnosis not present

## 2020-12-09 DIAGNOSIS — N3090 Cystitis, unspecified without hematuria: Secondary | ICD-10-CM | POA: Diagnosis not present

## 2020-12-09 DIAGNOSIS — R112 Nausea with vomiting, unspecified: Secondary | ICD-10-CM | POA: Diagnosis not present

## 2020-12-09 DIAGNOSIS — J45909 Unspecified asthma, uncomplicated: Secondary | ICD-10-CM | POA: Insufficient documentation

## 2020-12-09 DIAGNOSIS — Z7952 Long term (current) use of systemic steroids: Secondary | ICD-10-CM | POA: Diagnosis not present

## 2020-12-09 LAB — URINALYSIS, COMPLETE (UACMP) WITH MICROSCOPIC
Bacteria, UA: NONE SEEN
Bilirubin Urine: NEGATIVE
Glucose, UA: NEGATIVE mg/dL
Ketones, ur: NEGATIVE mg/dL
Nitrite: NEGATIVE
Protein, ur: 30 mg/dL — AB
Specific Gravity, Urine: 1.01 (ref 1.005–1.030)
WBC, UA: >50 WBC/hpf — ABNORMAL HIGH (ref 0–5)
pH: 9 — ABNORMAL HIGH (ref 5.0–8.0)

## 2020-12-09 LAB — COMPREHENSIVE METABOLIC PANEL
ALT: 14 U/L (ref 0–44)
AST: 13 U/L — ABNORMAL LOW (ref 15–41)
Albumin: 4 g/dL (ref 3.5–5.0)
Alkaline Phosphatase: 57 U/L (ref 38–126)
Anion gap: 6 (ref 5–15)
BUN: 11 mg/dL (ref 6–20)
CO2: 26 mmol/L (ref 22–32)
Calcium: 8.9 mg/dL (ref 8.9–10.3)
Chloride: 106 mmol/L (ref 98–111)
Creatinine, Ser: 0.56 mg/dL (ref 0.44–1.00)
GFR, Estimated: >60 mL/min (ref >60)
Glucose, Bld: 97 mg/dL (ref 70–99)
Potassium: 4 mmol/L (ref 3.5–5.1)
Sodium: 138 mmol/L (ref 135–145)
Total Bilirubin: 0.7 mg/dL (ref 0.3–1.2)
Total Protein: 7 g/dL (ref 6.5–8.1)

## 2020-12-09 LAB — POC URINE PREG, ED: Preg Test, Ur: NEGATIVE

## 2020-12-09 LAB — CBC WITH DIFFERENTIAL/PLATELET
Abs Immature Granulocytes: 0.02 10*3/uL (ref 0.00–0.07)
Basophils Absolute: 0 10*3/uL (ref 0.0–0.1)
Basophils Relative: 1 %
Eosinophils Absolute: 0.2 10*3/uL (ref 0.0–0.5)
Eosinophils Relative: 3 %
HCT: 35.7 % — ABNORMAL LOW (ref 36.0–46.0)
Hemoglobin: 11.8 g/dL — ABNORMAL LOW (ref 12.0–15.0)
Immature Granulocytes: 0 %
Lymphocytes Relative: 14 %
Lymphs Abs: 1 10*3/uL (ref 0.7–4.0)
MCH: 28.9 pg (ref 26.0–34.0)
MCHC: 33.1 g/dL (ref 30.0–36.0)
MCV: 87.3 fL (ref 80.0–100.0)
Monocytes Absolute: 0.4 10*3/uL (ref 0.1–1.0)
Monocytes Relative: 6 %
Neutro Abs: 5.5 10*3/uL (ref 1.7–7.7)
Neutrophils Relative %: 76 %
Platelets: 389 10*3/uL (ref 150–400)
RBC: 4.09 MIL/uL (ref 3.87–5.11)
RDW: 11.9 % (ref 11.5–15.5)
WBC: 7.1 10*3/uL (ref 4.0–10.5)
nRBC: 0 % (ref 0.0–0.2)

## 2020-12-09 LAB — LIPASE, BLOOD: Lipase: 35 U/L (ref 11–51)

## 2020-12-09 MED ORDER — ONDANSETRON HCL 4 MG/2ML IJ SOLN
4.0000 mg | Freq: Once | INTRAMUSCULAR | Status: AC
  Administered 2020-12-09: 4 mg via INTRAVENOUS
  Filled 2020-12-09: qty 2

## 2020-12-09 MED ORDER — PANTOPRAZOLE SODIUM 40 MG IV SOLR
40.0000 mg | Freq: Once | INTRAVENOUS | Status: AC
  Administered 2020-12-09: 40 mg via INTRAVENOUS
  Filled 2020-12-09: qty 40

## 2020-12-09 MED ORDER — FAMOTIDINE 20 MG PO TABS: 20.0000 mg | ORAL_TABLET | Freq: Two times a day (BID) | ORAL | 0 refills | Status: DC

## 2020-12-09 MED ORDER — SODIUM CHLORIDE 0.9 % IV BOLUS
1000.0000 mL | Freq: Once | INTRAVENOUS | Status: AC
  Administered 2020-12-09: 1000 mL via INTRAVENOUS

## 2020-12-09 MED ORDER — CEPHALEXIN 500 MG PO CAPS: 500.0000 mg | ORAL_CAPSULE | Freq: Three times a day (TID) | ORAL | 0 refills | Status: DC

## 2020-12-09 MED ORDER — ONDANSETRON 4 MG PO TBDP: 4.0000 mg | ORAL_TABLET | Freq: Three times a day (TID) | ORAL | 0 refills | Status: DC | PRN

## 2020-12-09 NOTE — ED Notes (Signed)
Dr. Scotty Court at bedside. MSE not signed at this time.

## 2020-12-09 NOTE — ED Triage Notes (Signed)
Pt via POV from work. Pt states that she has been having episode of emesis for 1 week. Pt states there is a possibility she Nieto be pregnant but unable to afford a preg test at home. Pt c/o epigastric pain. Pt is A&Ox3 and NAD. Pt states her LMP was last month but was abnormal. IUD was removed around Thanksgiving last year.

## 2020-12-09 NOTE — ED Notes (Signed)
Pt provided with iced water and crackers for PO challenge.

## 2020-12-09 NOTE — ED Provider Notes (Signed)
[facility] Emergency Department Provider Note  ____________________________________________  Time seen: Approximately 10:29 AM  I have reviewed the triage vital signs and the nursing notes.   HISTORY  Chief Complaint Emesis    HPI Tanya Mckee is a 20 y.o. female with a past history of GERD, hyperlipidemia, anxiety who comes ED complaining of epigastric pain for the past week.  Still, off and on, waxing waning, nonradiating, no aggravating or alleviating factors, associated vomiting.  No constipation or diarrhea.  No fevers or chills.  Notes that her.  Last month came late and was irregular.  No current contraception.  Suspect that she could be pregnant.      Past Medical History:  Diagnosis Date  . ADD (attention deficit disorder)   . ADHD (attention deficit hyperactivity disorder)   . Anxiety   . Asthma   . Depression   . GERD (gastroesophageal reflux disease)   . Hyperlipidemia      Patient Active Problem List   Diagnosis Date Noted  . Postpartum care following vaginal delivery 07/19/2017  . Gestational hypertension 07/16/2017  . Abdominal pain affecting pregnancy 05/29/2017  . Monilial vulvovaginitis 05/29/2017  . UTI (urinary tract infection) during pregnancy, third trimester 05/29/2017  . Dyspnea on exertion 10/20/2015  . Fatigue 10/20/2015  . Elevated blood pressure 06/16/2015  . 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 Allergic rhinitis 05/21/2015  . Asthma 05/21/2015  . GERD (gastroesophageal reflux disease) 05/21/2015  . HLD (hyperlipidemia) 05/21/2015  . Extreme obesity 05/21/2015  . Apnea, sleep 05/21/2015     Past Surgical History:  Procedure Laterality Date  . ORIF ANKLE FRACTURE Right 02/11/2016   Procedure: OPEN REDUCTION INTERNAL FIXATION (ORIF) ANKLE FRACTURE;  Surgeon: Gwyneth Revels, DPM;  Location: [facility] ORS;  Service: Podiatry;  Laterality: Right;  . TONSILLECTOMY AND ADENOIDECTOMY  2007  . UPPER  GI ENDOSCOPY  10/08/2009   Possible Esophagitis     Prior to Admission medications   Medication Sig Start Date End Date Taking? Authorizing Provider  cephALEXin (KEFLEX) 500 MG capsule Take 1 capsule (500 mg total) by mouth 3 (three) times daily. 12/09/20  Yes Sharman Cheek, MD  famotidine (PEPCID) 20 MG tablet Take 1 tablet (20 mg total) by mouth 2 (two) times daily. 12/09/20  Yes Sharman Cheek, MD  ondansetron (ZOFRAN ODT) 4 MG disintegrating tablet Take 1 tablet (4 mg total) by mouth every 8 (eight) hours as needed for nausea or vomiting. 12/09/20  Yes Sharman Cheek, MD  acetaminophen (TYLENOL) 325 MG tablet Take 2 tablets (650 mg total) by mouth every 4 (four) hours as needed (for pain scale < 4). 07/21/17   Schuman, Jaquelyn Bitter, MD  pantoprazole (PROTONIX) 40 MG tablet Take 1 tablet (40 mg total) by mouth daily. 11/21/17 11/21/18  Jeanmarie Plant, MD  albuterol (VENTOLIN HFA) 108 (90 Base) MCG/ACT inhaler Inhale 2 puffs into the lungs every 6 (six) hours as needed for wheezing or shortness of breath. Patient not taking: Reported on 07/15/2020 08/20/19 09/24/20  Nita Sickle, MD  budesonide-formoterol [facility]) 160-4.5 MCG/ACT inhaler Inhale 2 puffs into the lungs 2 (two) times daily. Patient not taking: Reported on 07/15/2020  09/24/20  [provider]  ferrous sulfate 325 (65 FE) MG EC tablet Take 325 mg by mouth daily with breakfast. Patient not taking: Reported on 07/15/2020  09/24/20  [provider]  labetalol (NORMODYNE) 200 MG tablet Take 1 tablet (200 mg total) by mouth 2 (two) times daily. Patient not taking: Reported  on 07/15/2020 07/21/17 09/24/20  Natale Milch, MD     Allergies Aspirin, Cetirizine, Latex, Shellfish allergy, and Other   Family History  Problem Relation Age of Onset  . Seizures Mother   . Stroke Mother   . Evelene Croon Parkinson White syndrome Father   . Breast cancer Maternal Grandmother   . Lupus Maternal Grandmother   .  Heart attack Maternal Grandfather   . CAD Maternal Grandfather        has stent  . Bone cancer Maternal Grandfather   . Kidney failure Paternal Grandmother   . Hypertension Paternal Grandfather   . CVA Paternal Grandfather   . Diabetes Other   . Diabetes Other   . Breast cancer Other   . Epilepsy Maternal Uncle     Social History Social History   Tobacco Use  . Smoking status: Never Smoker  . Smokeless tobacco: Never Used  Vaping Use  . Vaping Use: Never used  Substance Use Topics  . Alcohol use: No    Alcohol/week: 0.0 standard drinks  . Drug use: No    Review of Systems  Constitutional:   No fever or chills.  ENT:   No sore throat. No rhinorrhea. Cardiovascular:   No chest pain or syncope. Respiratory:   No dyspnea or cough. Gastrointestinal: Positive as above for abdominal pain and vomiting Musculoskeletal:   Negative for focal pain or swelling All other systems reviewed and are negative except as documented above in ROS and HPI.  ____________________________________________   PHYSICAL EXAM:  VITAL SIGNS: ED Triage Vitals  Enc Vitals Group     BP 12/09/20 1025 (!) 150/98     Pulse Rate 12/09/20 1025 74     Resp 12/09/20 1025 20     Temp 12/09/20 1025 98.1 F (36.7 C)     Temp Source 12/09/20 1025 Oral     SpO2 12/09/20 1025 100 %     Weight 12/09/20 1026 230 lb (104.3 kg)     Height 12/09/20 1026 5\' 5"  (1.651 m)     Head Circumference --      Peak Flow --      Pain Score 12/09/20 1025 6     Pain Loc --      Pain Edu? --      Excl. in GC? --     Vital signs reviewed, nursing assessments reviewed.   Constitutional:   Alert and oriented. Non-toxic appearance. Eyes:   Conjunctivae are normal. EOMI. PERRL. ENT      Head:   Normocephalic and atraumatic.      Nose:   Wearing a mask.      Mouth/Throat:   Wearing a mask.      Neck:   No meningismus. Full ROM. Hematological/Lymphatic/Immunilogical:   No cervical lymphadenopathy. Cardiovascular:   RRR.  Symmetric bilateral radial and DP pulses.  No murmurs. Cap refill less than 2 seconds. Respiratory:   Normal respiratory effort without tachypnea/retractions. Breath sounds are clear and equal bilaterally. No wheezes/rales/rhonchi. Gastrointestinal:   Soft with mild epigastric tenderness. Non distended. There is no CVA tenderness.  No rebound, rigidity, or guarding. Genitourinary:   deferred Musculoskeletal:   Normal range of motion in all extremities. No joint effusions.  No lower extremity tenderness.  No edema. Neurologic:   Normal speech and language.  Motor grossly intact. No acute focal neurologic deficits are appreciated.  Skin:    Skin is warm, dry and intact. No rash noted.  No petechiae, purpura, or bullae.  ____________________________________________    LABS (pertinent positives/negatives) (all labs ordered are listed, but only abnormal results are displayed) Labs Reviewed  COMPREHENSIVE METABOLIC PANEL - Abnormal; Notable for the following components:      Result Value   AST 13 (*)    All other components within normal limits  CBC WITH DIFFERENTIAL/PLATELET - Abnormal; Notable for the following components:   Hemoglobin 11.8 (*)    HCT 35.7 (*)    All other components within normal limits  URINALYSIS, COMPLETE (UACMP) WITH MICROSCOPIC - Abnormal; Notable for the following components:   Color, Urine YELLOW (*)    APPearance CLOUDY (*)    pH 9.0 (*)    Hgb urine dipstick MODERATE (*)    Protein, ur 30 (*)    Leukocytes,Ua LARGE (*)    WBC, UA >50 (*)    All other components within normal limits  LIPASE, BLOOD  POC URINE PREG, ED   ____________________________________________   EKG    ____________________________________________    RADIOLOGY  No results found.  ____________________________________________   PROCEDURES Procedures  ____________________________________________  DIFFERENTIAL DIAGNOSIS   GERD, pancreatitis, choledocholithiasis, biliary  colic, pregnancy  CLINICAL IMPRESSION / ASSESSMENT AND PLAN / ED COURSE  Medications ordered in the ED: Medications  sodium chloride 0.9 % bolus 1,000 mL (1,000 mLs Intravenous New Bag/Given 12/09/20 1033)  ondansetron (ZOFRAN) injection 4 mg (4 mg Intravenous Given 12/09/20 1109)  pantoprazole (PROTONIX) injection 40 mg (40 mg Intravenous Given 12/09/20 1106)    Pertinent labs & imaging results that were available during my care of the patient were reviewed by me and considered in my medical decision making (see chart for details).  Tanya Mckee was evaluated in Emergency Department on 12/09/2020 for the symptoms described in the history of present illness. She was evaluated in the context of the global COVID-19 pandemic, which necessitated consideration that the patient might be at risk for infection with the SARS-CoV-2 virus that causes COVID-19. Institutional protocols and algorithms that pertain to the evaluation of patients at risk for COVID-19 are in a state of rapid change based on information released by regulatory bodies including the CDC and federal and state organizations. These policies and algorithms were followed during the patient's care in the ED.   Patient presents with upper abdominal pain and vomiting.  Vital signs are normal, she is nontoxic with overall reassuring exam.  Abdomen is nonsurgical.  Doubt STI PID TOA torsion or ectopic.  Will give IV fluids, Protonix, Zofran, check labs.  ----------------------------------------- 12:31 PM on 12/09/2020 -----------------------------------------  Serum labs normal.  Urinalysis consistent with a UTI.  Vital signs remain normal, she is feeling better and eating crackers and drinking fluids, tolerating p.o. very well.  Requests a work note.  Stable for discharge with supportive care and Keflex.      ____________________________________________   FINAL CLINICAL IMPRESSION(S) / ED DIAGNOSES    Final diagnoses:   Non-intractable vomiting with nausea, unspecified vomiting type  Cystitis     ED Discharge Orders         Ordered    cephALEXin (KEFLEX) 500 MG capsule  3 times daily        12/09/20 1230    ondansetron (ZOFRAN ODT) 4 MG disintegrating tablet  Every 8 hours PRN        12/09/20 1230    famotidine (PEPCID) 20 MG tablet  2 times daily        12/09/20 1230  Portions of this note were generated with dragon dictation software. Dictation errors [place] occur despite best attempts at proofreading.   Sharman CheekStafford, Rasheen Schewe, MD 12/09/20 1231

## 2021-01-20 ENCOUNTER — Other Ambulatory Visit: Payer: Self-pay

## 2021-01-20 ENCOUNTER — Encounter: Payer: Self-pay | Admitting: Intensive Care

## 2021-01-20 ENCOUNTER — Emergency Department
Admission: EM | Admit: 2021-01-20 | Discharge: 2021-01-20 | Disposition: A | Discharge status: Home / Self Care | Payer: Medicaid Other | Attending: Emergency Medicine | Admitting: Emergency Medicine

## 2021-01-20 DIAGNOSIS — Z7951 Long term (current) use of inhaled steroids: Secondary | ICD-10-CM | POA: Insufficient documentation

## 2021-01-20 DIAGNOSIS — J45909 Unspecified asthma, uncomplicated: Secondary | ICD-10-CM | POA: Diagnosis not present

## 2021-01-20 DIAGNOSIS — Z91040 Latex allergy status: Secondary | ICD-10-CM | POA: Diagnosis not present

## 2021-01-20 DIAGNOSIS — R1011 Right upper quadrant pain: Secondary | ICD-10-CM | POA: Insufficient documentation

## 2021-01-20 DIAGNOSIS — R1031 Right lower quadrant pain: Secondary | ICD-10-CM | POA: Insufficient documentation

## 2021-01-20 DIAGNOSIS — R112 Nausea with vomiting, unspecified: Secondary | ICD-10-CM | POA: Diagnosis not present

## 2021-01-20 DIAGNOSIS — R109 Unspecified abdominal pain: Secondary | ICD-10-CM

## 2021-01-20 DIAGNOSIS — Z5321 Procedure and treatment not carried out due to patient leaving prior to being seen by health care provider: Secondary | ICD-10-CM | POA: Insufficient documentation

## 2021-01-20 DIAGNOSIS — R1030 Lower abdominal pain, unspecified: Secondary | ICD-10-CM | POA: Insufficient documentation

## 2021-01-20 LAB — URINALYSIS, ROUTINE W REFLEX MICROSCOPIC
Bacteria, UA: NONE SEEN
Bilirubin Urine: NEGATIVE
Glucose, UA: NEGATIVE mg/dL
Ketones, ur: NEGATIVE mg/dL
Nitrite: NEGATIVE
Protein, ur: 30 mg/dL — AB
RBC / HPF: >50 RBC/hpf — ABNORMAL HIGH (ref 0–5)
Specific Gravity, Urine: 1.033 — ABNORMAL HIGH (ref 1.005–1.030)
pH: 5 (ref 5.0–8.0)

## 2021-01-20 LAB — CBC WITH DIFFERENTIAL/PLATELET
Abs Immature Granulocytes: 0.02 10*3/uL (ref 0.00–0.07)
Basophils Absolute: 0.1 10*3/uL (ref 0.0–0.1)
Basophils Relative: 1 %
Eosinophils Absolute: 0.1 10*3/uL (ref 0.0–0.5)
Eosinophils Relative: 2 %
HCT: 34.7 % — ABNORMAL LOW (ref 36.0–46.0)
Hemoglobin: 11.3 g/dL — ABNORMAL LOW (ref 12.0–15.0)
Immature Granulocytes: 0 %
Lymphocytes Relative: 19 %
Lymphs Abs: 1.1 10*3/uL (ref 0.7–4.0)
MCH: 27.6 pg (ref 26.0–34.0)
MCHC: 32.6 g/dL (ref 30.0–36.0)
MCV: 84.8 fL (ref 80.0–100.0)
Monocytes Absolute: 0.4 10*3/uL (ref 0.1–1.0)
Monocytes Relative: 7 %
Neutro Abs: 4.1 10*3/uL (ref 1.7–7.7)
Neutrophils Relative %: 71 %
Platelets: 348 10*3/uL (ref 150–400)
RBC: 4.09 MIL/uL (ref 3.87–5.11)
RDW: 12 % (ref 11.5–15.5)
WBC: 5.9 10*3/uL (ref 4.0–10.5)
nRBC: 0 % (ref 0.0–0.2)

## 2021-01-20 LAB — COMPREHENSIVE METABOLIC PANEL
ALT: 15 U/L (ref 0–44)
AST: 14 U/L — ABNORMAL LOW (ref 15–41)
Albumin: 4.3 g/dL (ref 3.5–5.0)
Alkaline Phosphatase: 54 U/L (ref 38–126)
Anion gap: 7 (ref 5–15)
BUN: 19 mg/dL (ref 6–20)
CO2: 23 mmol/L (ref 22–32)
Calcium: 8.9 mg/dL (ref 8.9–10.3)
Chloride: 109 mmol/L (ref 98–111)
Creatinine, Ser: 0.66 mg/dL (ref 0.44–1.00)
GFR, Estimated: >60 mL/min (ref >60)
Glucose, Bld: 91 mg/dL (ref 70–99)
Potassium: 3.7 mmol/L (ref 3.5–5.1)
Sodium: 139 mmol/L (ref 135–145)
Total Bilirubin: 0.6 mg/dL (ref 0.3–1.2)
Total Protein: 7.2 g/dL (ref 6.5–8.1)

## 2021-01-20 LAB — POC URINE PREG, ED: Preg Test, Ur: NEGATIVE

## 2021-01-20 LAB — LIPASE, BLOOD: Lipase: 38 U/L (ref 11–51)

## 2021-01-20 MED ORDER — DICYCLOMINE HCL 10 MG PO CAPS: 10.0000 mg | ORAL_CAPSULE | Freq: Three times a day (TID) | ORAL | 0 refills | Status: DC | PRN

## 2021-01-20 MED ORDER — DICYCLOMINE HCL 10 MG PO CAPS
20.0000 mg | ORAL_CAPSULE | Freq: Once | ORAL | Status: AC
  Administered 2021-01-20: 20 mg via ORAL
  Filled 2021-01-20: qty 2

## 2021-01-20 MED ORDER — ONDANSETRON HCL 4 MG PO TABS: 4.0000 mg | ORAL_TABLET | Freq: Three times a day (TID) | ORAL | 0 refills | Status: DC | PRN

## 2021-01-20 NOTE — Discharge Instructions (Addendum)
Please seek medical attention for any high fevers, chest pain, shortness of breath, change in behavior, persistent vomiting, bloody stool or any other new or concerning symptoms.  

## 2021-01-20 NOTE — ED Triage Notes (Signed)
Pt in with co n.v. that started yesterday with lower abd pain. Was here earlier and discharged home. Told to come back if persistent vomiting.

## 2021-01-20 NOTE — ED Provider Notes (Signed)
[facility] Emergency Department Provider Note  ____________________________________________   I have reviewed the triage vital signs and the nursing notes.   HISTORY  Chief Complaint Abdominal Pain and Emesis   History limited by: Not Limited   HPI Tanya Mckee is a 20 y.o. female who presents to the emergency department today because of concern for abdominal pain. Patient states that the pain started two days ago. It is located in her right upper and lower abdomin. It was accompanied by vomiting yesterday and today. Yesterday she noticed some food in her emesis, and today had some blood in it. The patient did have a bowel movement today. No fevers. States she had slightly similar pain in the past when she was pregnant. Denies any abdominal surgeries.    Records reviewed. Per medical record review patient has a history of HLD.   Past Medical History:  Diagnosis Date   ADD (attention deficit disorder)    ADHD (attention deficit hyperactivity disorder)    Anxiety    Asthma    Depression    GERD (gastroesophageal reflux disease)    Hyperlipidemia     Patient Active Problem List   Diagnosis Date Noted   Postpartum care following vaginal delivery 07/19/2017   Gestational hypertension 07/16/2017   Abdominal pain affecting pregnancy 05/29/2017   Monilial vulvovaginitis 05/29/2017   UTI (urinary tract infection) during pregnancy, third trimester 05/29/2017   Dyspnea on exertion 10/20/2015   Fatigue 10/20/2015   Elevated blood pressure 06/16/2015   Attention deficit disorder with hyperactivity 05/21/2015   Allergic rhinitis 05/21/2015   Asthma 05/21/2015   GERD (gastroesophageal reflux disease) 05/21/2015   HLD (hyperlipidemia) 05/21/2015   Extreme obesity 05/21/2015   Apnea, sleep 05/21/2015    Past Surgical History:  Procedure Laterality Date   ORIF ANKLE FRACTURE Right 02/11/2016   Procedure: OPEN REDUCTION INTERNAL FIXATION (ORIF) ANKLE  FRACTURE;  Surgeon: Gwyneth Revels, DPM;  Location: [facility] ORS;  Service: Podiatry;  Laterality: Right;   TONSILLECTOMY AND ADENOIDECTOMY  2007   UPPER GI ENDOSCOPY  10/08/2009   Possible Esophagitis    Prior to Admission medications   Medication Sig Start Date End Date Taking? Authorizing Provider  acetaminophen (TYLENOL) 325 MG tablet Take 2 tablets (650 mg total) by mouth every 4 (four) hours as needed (for pain scale < 4). 07/21/17   Schuman, Jaquelyn Bitter, MD  cephALEXin (KEFLEX) 500 MG capsule Take 1 capsule (500 mg total) by mouth 3 (three) times daily. 12/09/20   Sharman Cheek, MD  famotidine (PEPCID) 20 MG tablet Take 1 tablet (20 mg total) by mouth 2 (two) times daily. 12/09/20   Sharman Cheek, MD  ondansetron (ZOFRAN ODT) 4 MG disintegrating tablet Take 1 tablet (4 mg total) by mouth every 8 (eight) hours as needed for nausea or vomiting. 12/09/20   Sharman Cheek, MD  pantoprazole (PROTONIX) 40 MG tablet Take 1 tablet (40 mg total) by mouth daily. 11/21/17 11/21/18  Jeanmarie Plant, MD  albuterol (VENTOLIN HFA) 108 (90 Base) MCG/ACT inhaler Inhale 2 puffs into the lungs every 6 (six) hours as needed for wheezing or shortness of breath. Patient not taking: Reported on 07/15/2020 08/20/19 09/24/20  Nita Sickle, MD  budesonide-formoterol [facility]) 160-4.5 MCG/ACT inhaler Inhale 2 puffs into the lungs 2 (two) times daily. Patient not taking: Reported on 07/15/2020  09/24/20  [provider]  ferrous sulfate 325 (65 FE) MG EC tablet Take 325 mg by mouth daily with breakfast. Patient not taking: Reported on 07/15/2020  09/24/20  [provider]  labetalol (NORMODYNE) 200 MG tablet Take 1 tablet (200 mg total) by mouth 2 (two) times daily. Patient not taking: Reported on 07/15/2020 07/21/17 09/24/20  Natale Milch, MD    Allergies Aspirin, Cetirizine, Latex, Shellfish allergy, and Other  Family History  Problem Relation Age of Onset   Seizures Mother     Stroke Mother    Phillips Odor White syndrome Father    Breast cancer Maternal Grandmother    Lupus Maternal Grandmother    Heart attack Maternal Grandfather    CAD Maternal Grandfather        has stent   Bone cancer Maternal Grandfather    Kidney failure Paternal Grandmother    Hypertension Paternal Grandfather    CVA Paternal Grandfather    Diabetes Other    Diabetes Other    Breast cancer Other    Epilepsy Maternal Uncle     Social History Social History   Tobacco Use   Smoking status: Never   Smokeless tobacco: Never  Vaping Use   Vaping Use: Never used  Substance Use Topics   Alcohol use: No    Alcohol/week: 0.0 standard drinks   Drug use: No    Review of Systems Constitutional: No fever/chills Eyes: No visual changes. ENT: No sore throat. Cardiovascular: Denies chest pain. Respiratory: Denies shortness of breath. Gastrointestinal: Positive for abdominal pain. Positive for vomiting.  Genitourinary: Negative for dysuria. Musculoskeletal: Negative for back pain. Skin: Negative for rash. Neurological: Negative for headaches, focal weakness or numbness.  ____________________________________________   PHYSICAL EXAM:  VITAL SIGNS: ED Triage Vitals [01/20/21 1609]  Enc Vitals Group     BP 126/77     Pulse Rate 92     Resp 16     Temp 98.9 F (37.2 C)     Temp Source Oral     SpO2 98 %     Weight 230 lb (104.3 kg)     Height 5\' 5"  (1.651 m)     Head Circumference      Peak Flow      Pain Score 8   Constitutional: Alert and oriented.  Eyes: Conjunctivae are normal.  ENT      Head: Normocephalic and atraumatic.      Nose: No congestion/rhinnorhea.      Mouth/Throat: Mucous membranes are moist.      Neck: No stridor. Hematological/Lymphatic/Immunilogical: No cervical lymphadenopathy. Cardiovascular: Normal rate, regular rhythm.  No murmurs, rubs, or gallops.  Respiratory: Normal respiratory effort without tachypnea nor retractions. Breath sounds  are clear and equal bilaterally. No wheezes/rales/rhonchi. Gastrointestinal: Soft and minimally tender along the right side of her abdomen.  Genitourinary: Deferred Musculoskeletal: Normal range of motion in all extremities. No lower extremity edema. Neurologic:  Normal speech and language. No gross focal neurologic deficits are appreciated.  Skin:  Skin is warm, dry and intact. No rash noted. Psychiatric: Mood and affect are normal. Speech and behavior are normal. Patient exhibits appropriate insight and judgment.  ____________________________________________    LABS (pertinent positives/negatives)  Upreg negative UA hazy, large hgb dipstick, trace leukocytes, >50 rbc, 6-10 wbc CBC wbc 5.9, hgb 11.3, plt 348 Lipase 38 CMP wnl except ast 14 ____________________________________________   EKG  None  ____________________________________________    RADIOLOGY  None  ____________________________________________   PROCEDURES  Procedures  ____________________________________________   INITIAL IMPRESSION / ASSESSMENT AND PLAN / ED COURSE  Pertinent labs & imaging results that were available during my care of the patient were  reviewed by me and considered in my medical decision making (see chart for details).   Patient presented to the emergency department today because of concern for abdominal pain and vomiting. On exam she did have some tenderness all along the right side of her abdomen. Blood work without concerning findings or leukocytosis. UA without signs concerning for infection. At this time I do have low suspicion for appendicitis. Discussed with patient possibility of getting vs deferring CT scan. At this time patient did feel comfortable deferring. Did feel significant improvement after bentyl. Will discharge with medication and discussed strict return precautions. Additionally doubt GB disease at this time given lack of significant RUQ tenderness.    ____________________________________________   FINAL CLINICAL IMPRESSION(S) / ED DIAGNOSES  Final diagnoses:  Abdominal pain, unspecified abdominal location  Nausea and vomiting, intractability of vomiting not specified, unspecified vomiting type     Note: This dictation was prepared with Dragon dictation. Any transcriptional errors that result from this process are unintentional     Phineas Semen, MD 01/20/21 1924

## 2021-01-20 NOTE — ED Notes (Signed)
PA student at bedside.

## 2021-01-20 NOTE — ED Triage Notes (Signed)
Patient c/o emesis and abdominal pain since yesterday.

## 2021-01-21 ENCOUNTER — Emergency Department
Admission: EM | Admit: 2021-01-21 | Discharge: 2021-01-21 | Disposition: A | Discharge status: Home / Self Care | Payer: Medicaid Other | Source: Home / Self Care

## 2021-01-21 NOTE — ED Notes (Signed)
No answer when called several times from lobby 

## 2021-02-17 ENCOUNTER — Emergency Department
Admission: EM | Admit: 2021-02-17 | Discharge: 2021-02-17 | Disposition: A | Discharge status: Home / Self Care | Payer: Medicaid Other | Attending: Emergency Medicine | Admitting: Emergency Medicine

## 2021-02-17 ENCOUNTER — Other Ambulatory Visit: Payer: Self-pay

## 2021-02-17 DIAGNOSIS — R1110 Vomiting, unspecified: Secondary | ICD-10-CM | POA: Diagnosis not present

## 2021-02-17 DIAGNOSIS — Z91040 Latex allergy status: Secondary | ICD-10-CM | POA: Insufficient documentation

## 2021-02-17 DIAGNOSIS — G8929 Other chronic pain: Secondary | ICD-10-CM | POA: Insufficient documentation

## 2021-02-17 DIAGNOSIS — R109 Unspecified abdominal pain: Secondary | ICD-10-CM

## 2021-02-17 DIAGNOSIS — Z7951 Long term (current) use of inhaled steroids: Secondary | ICD-10-CM | POA: Insufficient documentation

## 2021-02-17 DIAGNOSIS — K219 Gastro-esophageal reflux disease without esophagitis: Secondary | ICD-10-CM | POA: Diagnosis not present

## 2021-02-17 DIAGNOSIS — J45909 Unspecified asthma, uncomplicated: Secondary | ICD-10-CM | POA: Insufficient documentation

## 2021-02-17 DIAGNOSIS — R1031 Right lower quadrant pain: Secondary | ICD-10-CM | POA: Diagnosis not present

## 2021-02-17 LAB — URINALYSIS, COMPLETE (UACMP) WITH MICROSCOPIC
Bacteria, UA: NONE SEEN
Bilirubin Urine: NEGATIVE
Glucose, UA: NEGATIVE mg/dL
Ketones, ur: 5 mg/dL — AB
Nitrite: NEGATIVE
Protein, ur: 30 mg/dL — AB
Specific Gravity, Urine: 1.038 — ABNORMAL HIGH (ref 1.005–1.030)
pH: 5 (ref 5.0–8.0)

## 2021-02-17 LAB — COMPREHENSIVE METABOLIC PANEL
ALT: 15 U/L (ref 0–44)
AST: 13 U/L — ABNORMAL LOW (ref 15–41)
Albumin: 3.9 g/dL (ref 3.5–5.0)
Alkaline Phosphatase: 52 U/L (ref 38–126)
Anion gap: 5 (ref 5–15)
BUN: 17 mg/dL (ref 6–20)
CO2: 27 mmol/L (ref 22–32)
Calcium: 9.3 mg/dL (ref 8.9–10.3)
Chloride: 106 mmol/L (ref 98–111)
Creatinine, Ser: 0.78 mg/dL (ref 0.44–1.00)
GFR, Estimated: >60 mL/min (ref >60)
Glucose, Bld: 96 mg/dL (ref 70–99)
Potassium: 4 mmol/L (ref 3.5–5.1)
Sodium: 138 mmol/L (ref 135–145)
Total Bilirubin: 0.3 mg/dL (ref 0.3–1.2)
Total Protein: 7 g/dL (ref 6.5–8.1)

## 2021-02-17 LAB — CBC
HCT: 34.7 % — ABNORMAL LOW (ref 36.0–46.0)
Hemoglobin: 11.3 g/dL — ABNORMAL LOW (ref 12.0–15.0)
MCH: 27.6 pg (ref 26.0–34.0)
MCHC: 32.6 g/dL (ref 30.0–36.0)
MCV: 84.8 fL (ref 80.0–100.0)
Platelets: 323 10*3/uL (ref 150–400)
RBC: 4.09 MIL/uL (ref 3.87–5.11)
RDW: 12.6 % (ref 11.5–15.5)
WBC: 5.8 10*3/uL (ref 4.0–10.5)
nRBC: 0 % (ref 0.0–0.2)

## 2021-02-17 LAB — POC URINE PREG, ED: Preg Test, Ur: NEGATIVE

## 2021-02-17 MED ORDER — DICYCLOMINE HCL 10 MG PO CAPS
10.0000 mg | ORAL_CAPSULE | Freq: Once | ORAL | Status: AC
  Administered 2021-02-17: 10 mg via ORAL
  Filled 2021-02-17: qty 1

## 2021-02-17 MED ORDER — DICYCLOMINE HCL 10 MG PO CAPS: 10.0000 mg | ORAL_CAPSULE | Freq: Three times a day (TID) | ORAL | 0 refills | Status: DC | PRN

## 2021-02-17 NOTE — ED Triage Notes (Signed)
Pt in with co rlq pain hx of the same states was here a month ago and was told it was her appendix. States they did not remove it and looking at encounter she did not have CT done. Pt did have some vomiting today, no dysuria or fever.

## 2021-02-17 NOTE — Discharge Instructions (Addendum)
Please use Bentyl for your abdominal pain. Take up to 3 times daily, as needed, for your pain. If you develop any fevers with your symptoms or uncontrolled pain, please return to the ED.
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
## 2021-02-17 NOTE — ED Notes (Signed)
ED Provider at bedside. 

## 2021-04-08 ENCOUNTER — Emergency Department
Admission: EM | Admit: 2021-04-08 | Discharge: 2021-04-08 | Disposition: A | Discharge status: Home / Self Care | Payer: Medicaid Other | Attending: Emergency Medicine | Admitting: Emergency Medicine

## 2021-04-08 ENCOUNTER — Other Ambulatory Visit: Payer: Self-pay

## 2021-04-08 DIAGNOSIS — A549 Gonococcal infection, unspecified: Secondary | ICD-10-CM | POA: Diagnosis not present

## 2021-04-08 DIAGNOSIS — N730 Acute parametritis and pelvic cellulitis: Secondary | ICD-10-CM

## 2021-04-08 DIAGNOSIS — J45909 Unspecified asthma, uncomplicated: Secondary | ICD-10-CM | POA: Diagnosis not present

## 2021-04-08 DIAGNOSIS — N739 Female pelvic inflammatory disease, unspecified: Secondary | ICD-10-CM | POA: Diagnosis not present

## 2021-04-08 DIAGNOSIS — A749 Chlamydial infection, unspecified: Secondary | ICD-10-CM | POA: Diagnosis not present

## 2021-04-08 DIAGNOSIS — Z91040 Latex allergy status: Secondary | ICD-10-CM | POA: Insufficient documentation

## 2021-04-08 DIAGNOSIS — S301XXA Contusion of abdominal wall, initial encounter: Secondary | ICD-10-CM | POA: Diagnosis not present

## 2021-04-08 DIAGNOSIS — N939 Abnormal uterine and vaginal bleeding, unspecified: Secondary | ICD-10-CM | POA: Diagnosis not present

## 2021-04-08 DIAGNOSIS — N9489 Other specified conditions associated with female genital organs and menstrual cycle: Secondary | ICD-10-CM | POA: Insufficient documentation

## 2021-04-08 LAB — WET PREP, GENITAL
Clue Cells Wet Prep HPF POC: NONE SEEN
Sperm: NONE SEEN
Trich, Wet Prep: NONE SEEN
Yeast Wet Prep HPF POC: NONE SEEN

## 2021-04-08 LAB — COMPREHENSIVE METABOLIC PANEL
ALT: 13 U/L (ref 0–44)
AST: 11 U/L — ABNORMAL LOW (ref 15–41)
Albumin: 4 g/dL (ref 3.5–5.0)
Alkaline Phosphatase: 52 U/L (ref 38–126)
Anion gap: 6 (ref 5–15)
BUN: 16 mg/dL (ref 6–20)
CO2: 27 mmol/L (ref 22–32)
Calcium: 9.3 mg/dL (ref 8.9–10.3)
Chloride: 106 mmol/L (ref 98–111)
Creatinine, Ser: 0.77 mg/dL (ref 0.44–1.00)
GFR, Estimated: >60 mL/min (ref >60)
Glucose, Bld: 102 mg/dL — ABNORMAL HIGH (ref 70–99)
Potassium: 3.8 mmol/L (ref 3.5–5.1)
Sodium: 139 mmol/L (ref 135–145)
Total Bilirubin: 0.5 mg/dL (ref 0.3–1.2)
Total Protein: 7.3 g/dL (ref 6.5–8.1)

## 2021-04-08 LAB — URINALYSIS, COMPLETE (UACMP) WITH MICROSCOPIC
Bacteria, UA: NONE SEEN
Bilirubin Urine: NEGATIVE
Glucose, UA: NEGATIVE mg/dL
Ketones, ur: NEGATIVE mg/dL
Nitrite: NEGATIVE
Protein, ur: 100 mg/dL — AB
RBC / HPF: >50 RBC/hpf — ABNORMAL HIGH (ref 0–5)
Specific Gravity, Urine: 1.028 (ref 1.005–1.030)
pH: 5 (ref 5.0–8.0)

## 2021-04-08 LAB — HCG, QUANTITATIVE, PREGNANCY: hCG, Beta Chain, Quant, S: 1 m[IU]/mL (ref <5)

## 2021-04-08 LAB — CBC
HCT: 34.7 % — ABNORMAL LOW (ref 36.0–46.0)
Hemoglobin: 11.7 g/dL — ABNORMAL LOW (ref 12.0–15.0)
MCH: 28 pg (ref 26.0–34.0)
MCHC: 33.7 g/dL (ref 30.0–36.0)
MCV: 83 fL (ref 80.0–100.0)
Platelets: 343 10*3/uL (ref 150–400)
RBC: 4.18 MIL/uL (ref 3.87–5.11)
RDW: 13.2 % (ref 11.5–15.5)
WBC: 6.8 10*3/uL (ref 4.0–10.5)
nRBC: 0 % (ref 0.0–0.2)

## 2021-04-08 LAB — RPR: RPR Ser Ql: NONREACTIVE

## 2021-04-08 LAB — CHLAMYDIA/NGC RT PCR (ARMC ONLY)
Chlamydia Tr: DETECTED — AB
N gonorrhoeae: DETECTED — AB

## 2021-04-08 LAB — HIV ANTIBODY (ROUTINE TESTING W REFLEX): HIV Screen 4th Generation wRfx: NONREACTIVE

## 2021-04-08 LAB — POC URINE PREG, ED: Preg Test, Ur: NEGATIVE

## 2021-04-08 MED ORDER — DOXYCYCLINE HYCLATE 100 MG PO TABS
100.0000 mg | ORAL_TABLET | Freq: Once | ORAL | Status: AC
  Administered 2021-04-08: 100 mg via ORAL
  Filled 2021-04-08: qty 1

## 2021-04-08 MED ORDER — CEFTRIAXONE SODIUM 1 G IJ SOLR
500.0000 mg | Freq: Once | INTRAMUSCULAR | Status: AC
  Administered 2021-04-08: 500 mg via INTRAMUSCULAR
  Filled 2021-04-08: qty 10

## 2021-04-08 MED ORDER — ONDANSETRON 4 MG PO TBDP
4.0000 mg | ORAL_TABLET | Freq: Once | ORAL | Status: AC
  Administered 2021-04-08: 4 mg via ORAL
  Filled 2021-04-08: qty 1

## 2021-04-08 MED ORDER — METRONIDAZOLE 500 MG PO TABS: 500.0000 mg | ORAL_TABLET | Freq: Two times a day (BID) | ORAL | 0 refills | Status: AC

## 2021-04-08 MED ORDER — DOXYCYCLINE HYCLATE 100 MG PO CAPS: 100.0000 mg | ORAL_CAPSULE | Freq: Two times a day (BID) | ORAL | 0 refills | Status: AC

## 2021-04-08 MED ORDER — METRONIDAZOLE 500 MG PO TABS
500.0000 mg | ORAL_TABLET | Freq: Once | ORAL | Status: AC
  Administered 2021-04-08: 500 mg via ORAL
  Filled 2021-04-08: qty 1

## 2021-04-08 MED ORDER — ONDANSETRON HCL 4 MG PO TABS: 4.0000 mg | ORAL_TABLET | Freq: Every day | ORAL | 0 refills | Status: DC | PRN

## 2021-04-08 MED ORDER — LIDOCAINE HCL (PF) 1 % IJ SOLN
1.0000 mL | Freq: Once | INTRAMUSCULAR | Status: AC
  Administered 2021-04-08: 1 mL
  Filled 2021-04-08: qty 5
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
## 2021-04-08 NOTE — ED Notes (Signed)
Pt. Provided warm blanket

## 2021-04-08 NOTE — Discharge Instructions (Addendum)
Your pregnancy tests were negative today, and your blood work was generally reassuring, but you tested positive for both gonorrhea and chlamydia.  Please abstain from any sexual contact for least 2 weeks.  We began treatment tonight but you need to take both of the antibiotics prescribed (doxycycline and metronidazole) for the next 2 weeks.  Do not stop the medications early.  Remember that you can contract the diseases again if you have unprotected sexual contact with infected partners.  Please remember to check MyChart over the next couple of days so you can find the results to your RPR (syphilis) test and your HIV test.  If either of these tests are positive, it is important that you follow-up with your primary care doctor or at the [facility] department.    Return to the emergency department if you develop new or worsening symptoms that concern you.

## 2021-04-08 NOTE — ED Notes (Signed)
MD and this RN at bedside for pelvic exam.
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
## 2021-04-08 NOTE — ED Notes (Signed)
Pt. Provided crackers and water.

## 2021-04-27 ENCOUNTER — Other Ambulatory Visit: Payer: Self-pay

## 2021-04-27 ENCOUNTER — Ambulatory Visit: Payer: Medicaid Other | Admitting: Physician Assistant

## 2021-04-27 DIAGNOSIS — Z113 Encounter for screening for infections with a predominantly sexual mode of transmission: Secondary | ICD-10-CM
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
## 2021-04-27 NOTE — Progress Notes (Signed)
Pt here to be retested for Gonorrhea/Chlamydia.  Pt completed antibiotics 04/25/2021.  Informed pt that we could not retest her for another 3-4 weeks and she should reschedule her appointment the week of 05-21-2021.

## 2021-06-16 ENCOUNTER — Encounter: Payer: Self-pay | Admitting: *Deleted

## 2021-06-16 ENCOUNTER — Other Ambulatory Visit: Payer: Self-pay

## 2021-06-16 ENCOUNTER — Emergency Department
Admission: EM | Admit: 2021-06-16 | Discharge: 2021-06-17 | Disposition: A | Discharge status: Home / Self Care | Payer: Medicaid Other | Attending: Emergency Medicine | Admitting: Emergency Medicine

## 2021-06-16 DIAGNOSIS — Z91040 Latex allergy status: Secondary | ICD-10-CM | POA: Insufficient documentation

## 2021-06-16 DIAGNOSIS — N939 Abnormal uterine and vaginal bleeding, unspecified: Secondary | ICD-10-CM | POA: Diagnosis present

## 2021-06-16 DIAGNOSIS — J45909 Unspecified asthma, uncomplicated: Secondary | ICD-10-CM | POA: Diagnosis not present

## 2021-06-16 DIAGNOSIS — N938 Other specified abnormal uterine and vaginal bleeding: Secondary | ICD-10-CM | POA: Diagnosis not present

## 2021-06-16 DIAGNOSIS — R102 Pelvic and perineal pain: Secondary | ICD-10-CM

## 2021-06-16 LAB — URINALYSIS, COMPLETE (UACMP) WITH MICROSCOPIC
Bilirubin Urine: NEGATIVE
Glucose, UA: NEGATIVE mg/dL
Ketones, ur: NEGATIVE mg/dL
Nitrite: NEGATIVE
Protein, ur: NEGATIVE mg/dL
Specific Gravity, Urine: 1.03 (ref 1.005–1.030)
pH: 5 (ref 5.0–8.0)

## 2021-06-16 LAB — BASIC METABOLIC PANEL
Anion gap: 8 (ref 5–15)
BUN: 17 mg/dL (ref 6–20)
CO2: 26 mmol/L (ref 22–32)
Calcium: 9.3 mg/dL (ref 8.9–10.3)
Chloride: 104 mmol/L (ref 98–111)
Creatinine, Ser: 0.7 mg/dL (ref 0.44–1.00)
GFR, Estimated: >60 mL/min (ref >60)
Glucose, Bld: 92 mg/dL (ref 70–99)
Potassium: 4 mmol/L (ref 3.5–5.1)
Sodium: 138 mmol/L (ref 135–145)

## 2021-06-16 LAB — CBC
HCT: 35.4 % — ABNORMAL LOW (ref 36.0–46.0)
Hemoglobin: 11.7 g/dL — ABNORMAL LOW (ref 12.0–15.0)
MCH: 27.4 pg (ref 26.0–34.0)
MCHC: 33.1 g/dL (ref 30.0–36.0)
MCV: 82.9 fL (ref 80.0–100.0)
Platelets: 327 10*3/uL (ref 150–400)
RBC: 4.27 MIL/uL (ref 3.87–5.11)
RDW: 13.4 % (ref 11.5–15.5)
WBC: 6.2 10*3/uL (ref 4.0–10.5)
nRBC: 0 % (ref 0.0–0.2)

## 2021-06-16 LAB — POC URINE PREG, ED: Preg Test, Ur: NEGATIVE

## 2021-06-16 NOTE — ED Triage Notes (Signed)
Pt has lower abd pain with vag bleeding   sx began last night.  Menses was last week.  Pt reports burning with urination and vaginal discharge.    Pt alert

## 2021-06-16 NOTE — ED Notes (Signed)
Pt unable to void at this time. 

## 2021-06-17 ENCOUNTER — Emergency Department: Payer: Medicaid Other

## 2021-06-17 LAB — CHLAMYDIA/NGC RT PCR (ARMC ONLY)
Chlamydia Tr: NOT DETECTED
N gonorrhoeae: NOT DETECTED

## 2021-06-17 LAB — WET PREP, GENITAL
Clue Cells Wet Prep HPF POC: NONE SEEN
Sperm: NONE SEEN
Trich, Wet Prep: NONE SEEN
WBC, Wet Prep HPF POC: NONE SEEN
Yeast Wet Prep HPF POC: NONE SEEN

## 2021-06-17 LAB — HCG, QUANTITATIVE, PREGNANCY: hCG, Beta Chain, Quant, S: 1 m[IU]/mL (ref <5)

## 2021-06-17 NOTE — ED Notes (Signed)
To radiology for utz at this time

## 2021-06-17 NOTE — ED Provider Notes (Signed)
[facility] Emergency Department Provider Note  ____________________________________________  Time seen: Approximately 1:03 AM  I have reviewed the triage vital signs and the nursing notes.   HISTORY  Chief Complaint Vaginal Bleeding   HPI Tanya Mckee is a 20 y.o. female who presents for evaluation of vaginal bleeding.  Patient reports that she had her normal menses last week.  Started bleeding again today.  She has had mild cramping suprapubic abdominal pain, some burning with urination and very mild vaginal discharge.  She denies having history of abnormal menses.  She denies being on any blood thinners, dizziness, chest pain, shortness of breath, history of bleeding disorder   Past Medical History:  Diagnosis Date   ADD (attention deficit disorder)    ADHD (attention deficit hyperactivity disorder)    Anxiety    Asthma    Depression    GERD (gastroesophageal reflux disease)    Hyperlipidemia     Patient Active Problem List   Diagnosis Date Noted   Postpartum care following vaginal delivery 07/19/2017   Gestational hypertension 07/16/2017   Abdominal pain affecting pregnancy 05/29/2017   Monilial vulvovaginitis 05/29/2017   UTI (urinary tract infection) during pregnancy, third trimester 05/29/2017   Dyspnea on exertion 10/20/2015   Fatigue 10/20/2015   Elevated blood pressure 06/16/2015   Attention deficit disorder with hyperactivity 05/21/2015   Allergic rhinitis 05/21/2015   Asthma 05/21/2015   GERD (gastroesophageal reflux disease) 05/21/2015   HLD (hyperlipidemia) 05/21/2015   Extreme obesity 05/21/2015   Apnea, sleep 05/21/2015    Past Surgical History:  Procedure Laterality Date   ORIF ANKLE FRACTURE Right 02/11/2016   Procedure: OPEN REDUCTION INTERNAL FIXATION (ORIF) ANKLE FRACTURE;  Surgeon: Gwyneth Revels, DPM;  Location: [facility] ORS;  Service: Podiatry;  Laterality: Right;   TONSILLECTOMY AND ADENOIDECTOMY  2007   UPPER GI  ENDOSCOPY  10/08/2009   Possible Esophagitis    Prior to Admission medications   Medication Sig Start Date End Date Taking? Authorizing Provider  acetaminophen (TYLENOL) 325 MG tablet Take 2 tablets (650 mg total) by mouth every 4 (four) hours as needed (for pain scale < 4). 07/21/17   Schuman, Jaquelyn Bitter, MD  cephALEXin (KEFLEX) 500 MG capsule Take 1 capsule (500 mg total) by mouth 3 (three) times daily. 12/09/20   Sharman Cheek, MD  dicyclomine (BENTYL) 10 MG capsule Take 1 capsule (10 mg total) by mouth 3 (three) times daily as needed for up to 14 days for spasms. 01/20/21 02/03/21  Phineas Semen, MD  dicyclomine (BENTYL) 10 MG capsule Take 1 capsule (10 mg total) by mouth 3 (three) times daily as needed for up to 14 days for spasms (abd cramping). 02/17/21 03/03/21  Delton Prairie, MD  famotidine (PEPCID) 20 MG tablet Take 1 tablet (20 mg total) by mouth 2 (two) times daily. 12/09/20   Sharman Cheek, MD  ondansetron (ZOFRAN ODT) 4 MG disintegrating tablet Take 1 tablet (4 mg total) by mouth every 8 (eight) hours as needed for nausea or vomiting. 12/09/20   Sharman Cheek, MD  ondansetron [facility]) 4 MG tablet Take 1 tablet (4 mg total) by mouth daily as needed for nausea or vomiting. 04/08/21 04/08/22  Loleta Rose, MD  pantoprazole (PROTONIX) 40 MG tablet Take 1 tablet (40 mg total) by mouth daily. 11/21/17 11/21/18  Jeanmarie Plant, MD  albuterol (VENTOLIN HFA) 108 (90 Base) MCG/ACT inhaler Inhale 2 puffs into the lungs every 6 (six) hours as needed for wheezing or shortness of breath. Patient  not taking: Reported on 07/15/2020 08/20/19 09/24/20  Nita Sickle, MD  budesonide-formoterol [facility]) 160-4.5 MCG/ACT inhaler Inhale 2 puffs into the lungs 2 (two) times daily. Patient not taking: Reported on 07/15/2020  09/24/20  [provider]  ferrous sulfate 325 (65 FE) MG EC tablet Take 325 mg by mouth daily with breakfast. Patient not taking: Reported on 07/15/2020  09/24/20   [provider]  labetalol (NORMODYNE) 200 MG tablet Take 1 tablet (200 mg total) by mouth 2 (two) times daily. Patient not taking: Reported on 07/15/2020 07/21/17 09/24/20  Natale Milch, MD    Allergies Aspirin, Cetirizine, Latex, Shellfish allergy, and Other  Family History  Problem Relation Age of Onset   Seizures Mother    Stroke Mother    Phillips Odor White syndrome Father    Breast cancer Maternal Grandmother    Lupus Maternal Grandmother    Heart attack Maternal Grandfather    CAD Maternal Grandfather        has stent   Bone cancer Maternal Grandfather    Kidney failure Paternal Grandmother    Hypertension Paternal Grandfather    CVA Paternal Grandfather    Diabetes Other    Diabetes Other    Breast cancer Other    Epilepsy Maternal Uncle     Social History Social History   Tobacco Use   Smoking status: Never   Smokeless tobacco: Never  Vaping Use   Vaping Use: Never used  Substance Use Topics   Alcohol use: No    Alcohol/week: 0.0 standard drinks   Drug use: No    Review of Systems  Constitutional: Negative for fever. Eyes: Negative for visual changes. ENT: Negative for sore throat. Neck: No neck pain  Cardiovascular: Negative for chest pain. Respiratory: Negative for shortness of breath. Gastrointestinal: Negative for abdominal pain, vomiting or diarrhea. Genitourinary: + dysuria, vaginal bleeding, vaginal discharge Musculoskeletal: Negative for back pain. Skin: Negative for rash. Neurological: Negative for headaches, weakness or numbness. Psych: No SI or HI  ____________________________________________   PHYSICAL EXAM:  VITAL SIGNS: ED Triage Vitals  Enc Vitals Group     BP 06/16/21 1934 136/88     Pulse Rate 06/16/21 1934 87     Resp 06/16/21 1934 18     Temp 06/16/21 1934 98.2 F (36.8 C)     Temp src --      SpO2 06/16/21 1934 94 %     Weight 06/16/21 1934 235 lb (106.6 kg)     Height 06/16/21 1934 5\' 5"  (1.651 m)      Head Circumference --      Peak Flow --      Pain Score 06/16/21 1934 6     Pain Loc --      Pain Edu? --      Excl. in GC? --     Constitutional: Alert and oriented. Well appearing and in no apparent distress. HEENT:      Head: Normocephalic and atraumatic.         Eyes: Conjunctivae are normal. Sclera is non-icteric.       Mouth/Throat: Mucous membranes are moist.       Neck: Supple with no signs of meningismus. Cardiovascular: Regular rate and rhythm. No murmurs, gallops, or rubs. 2+ symmetrical distal pulses are present in all extremities. No JVD. Respiratory: Normal respiratory effort. Lungs are clear to auscultation bilaterally.  Gastrointestinal: Soft, non tender, and non distended with positive bowel sounds. No rebound or guarding. Pelvic exam: Normal external  genitalia, no rashes or lesions. Blood in the vaginal vault. Os closed. No cervical motion tenderness.  No uterine or adnexal tenderness.    Musculoskeletal:  No edema, cyanosis, or erythema of extremities. Neurologic: Normal speech and language. Face is symmetric. Moving all extremities. No gross focal neurologic deficits are appreciated. Skin: Skin is warm, dry and intact. No rash noted. Psychiatric: Mood and affect are normal. Speech and behavior are normal.  ____________________________________________   LABS (all labs ordered are listed, but only abnormal results are displayed)  Labs Reviewed  CBC - Abnormal; Notable for the following components:      Result Value   Hemoglobin 11.7 (*)    HCT 35.4 (*)    All other components within normal limits  URINALYSIS, COMPLETE (UACMP) WITH MICROSCOPIC - Abnormal; Notable for the following components:   Color, Urine YELLOW (*)    APPearance HAZY (*)    Hgb urine dipstick LARGE (*)    Leukocytes,Ua SMALL (*)    Bacteria, UA RARE (*)    All other components within normal limits  WET PREP, GENITAL  CHLAMYDIA/NGC RT PCR ([facility] ONLY)            BASIC METABOLIC PANEL   HCG, QUANTITATIVE, PREGNANCY  POC URINE PREG, ED   ____________________________________________  EKG  none  ____________________________________________  RADIOLOGY  I have personally reviewed the images performed during this visit and I agree with the Radiologist's read.   Interpretation by Radiologist:  US PELVIC COMPLETE W TRANSVAGINAL AND TORSION R/O  Result Date: 06/17/2021 CLINICAL DATA:  Pelvic pain.  LMP 06/09/2021 EXAM: TRANSABDOMINAL AND TRANSVAGINAL ULTRASOUND OF PELVIS DOPPLER ULTRASOUND OF OVARIES TECHNIQUE: Both transabdominal and transvaginal ultrasound examinations of the pelvis were performed. Transabdominal technique was performed for global imaging of the pelvis including uterus, ovaries, adnexal regions, and pelvic cul-de-sac. It was necessary to proceed with endovaginal exam following the transabdominal exam to visualize the endometrium and ovaries bilaterally. Color and duplex Doppler ultrasound was utilized to evaluate blood flow to the ovaries. COMPARISON:  10/30/2020 FINDINGS: Uterus Measurements: 7.5 x 3.4 x 4.8 cm = volume: 64 mL. No fibroids or other mass visualized. The uterus is anteverted. The cervix is unremarkable. Endometrium Thickness: 7 mm.  No focal abnormality visualized. Right ovary Measurements: 3.2 x 2.0 x 2.2 cm = volume: 8 mL. Normal appearance/no adnexal mass. Dominant follicle noted. Left ovary Measurements: 2.5 x 1.3 x 2.1 cm = volume: 4 mL. Normal appearance/no adnexal mass. Pulsed Doppler evaluation of both ovaries demonstrates normal low-resistance arterial and venous waveforms. Other findings No abnormal free fluid. IMPRESSION: Normal pelvic sonogram Electronically Signed   By: Helyn Numbers M.D.   On: 06/17/2021 01:58     ____________________________________________   PROCEDURES  Procedure(s) performed: None Procedures   Critical Care performed:  None ____________________________________________   INITIAL IMPRESSION / ASSESSMENT  AND PLAN / ED COURSE  20 y.o. female who presents for evaluation of irregular vaginal bleeding, dysuria and vaginal discharge.  Differential diagnoses including STD versus PID versus UTI versus pregnancy versus miscarriage versus ectopic.  Will get a pelvic exam, transvaginal ultrasound, UA, hCG.  Patient is hemodynamically stable, normal hemoglobin, no leukocytosis or signs of sepsis.  Normal chemistry panel.  UA showing blood consistent with her being on her period.  No signs of UTI.  hCG is 1.  Transvaginal ultrasound is pending.  _________________________ 2:17 AM on 06/17/2021 ----------------------------------------- Transvaginal ultrasound with no acute abnormalities.  Pelvic exam is within normal limits.  Pelvic swabs are pending.  This is most likely a abnormal period.  Will discharge home on supportive care and follow-up with primary care doctor.  Discussed my standard return precautions    _________________________ 4:39 AM on 06/17/2021 ----------------------------------------- STD negative _____________________________________________ Please note:  Patient was evaluated in Emergency Department today for the symptoms described in the history of present illness. Patient was evaluated in the context of the global COVID-19 pandemic, which necessitated consideration that the patient might be at risk for infection with the SARS-CoV-2 virus that causes COVID-19. Institutional protocols and algorithms that pertain to the evaluation of patients at risk for COVID-19 are in a state of rapid change based on information released by regulatory bodies including the CDC and federal and state organizations. These policies and algorithms were followed during the patient's care in the ED.  Some ED evaluations and interventions [place] be delayed as a result of limited staffing during the pandemic.   [place] Controlled Substance Database was reviewed by  me. ____________________________________________   FINAL CLINICAL IMPRESSION(S) / ED DIAGNOSES   Final diagnoses:  Pelvic pain  Dysfunctional uterine bleeding  Abnormal uterine bleeding (AUB)      NEW MEDICATIONS STARTED DURING THIS VISIT:  ED Discharge Orders     None        Note:  This document was prepared using Dragon voice recognition software and [place] include unintentional dictation errors.    Nita Sickle, MD 06/17/21 [phone]

## 2021-06-27 ENCOUNTER — Ambulatory Visit: Payer: Medicaid Other

## 2021-07-06 ENCOUNTER — Ambulatory Visit: Payer: Medicaid Other

## 2021-07-19 ENCOUNTER — Ambulatory Visit: Payer: Medicaid Other

## 2021-08-16 ENCOUNTER — Ambulatory Visit: Payer: Medicaid Other

## 2021-09-22 ENCOUNTER — Other Ambulatory Visit: Payer: Self-pay

## 2021-09-22 ENCOUNTER — Encounter: Payer: Self-pay | Admitting: Family Medicine

## 2021-09-22 ENCOUNTER — Ambulatory Visit (LOCAL_COMMUNITY_HEALTH_CENTER): Payer: Medicaid Other | Admitting: Nurse Practitioner

## 2021-09-22 VITALS — BP 135/83 | Ht 65.0 in | Wt 262.8 lb

## 2021-09-22 DIAGNOSIS — Z30430 Encounter for insertion of intrauterine contraceptive device: Secondary | ICD-10-CM

## 2021-09-22 DIAGNOSIS — Z01419 Encounter for gynecological examination (general) (routine) without abnormal findings: Secondary | ICD-10-CM

## 2021-09-22 DIAGNOSIS — Z3009 Encounter for other general counseling and advice on contraception: Secondary | ICD-10-CM | POA: Diagnosis not present

## 2021-09-22 MED ORDER — LEVONORGESTREL 20 MCG/DAY IU IUD
1.0000 | INTRAUTERINE_SYSTEM | Freq: Once | INTRAUTERINE | Status: AC
  Administered 2021-09-22: 1 via INTRAUTERINE

## 2021-09-22 NOTE — Progress Notes (Signed)
[facility] DEPARTMENT [facility] [address] Main Number: [phone]    Family Planning Visit- Initial Visit  Subjective:  Tanya Mckee is a 21 y.o.  G2P1001   being seen today for an initial annual visit and to discuss reproductive life planning.  The patient is currently using No Method - Other Reason for pregnancy prevention. Patient reports   does not want a pregnancy in the next year.  Patient has the following medical conditions has Attention deficit disorder with hyperactivity; Allergic rhinitis; Asthma; GERD (gastroesophageal reflux disease); HLD (hyperlipidemia); Extreme obesity; Apnea, sleep; Elevated blood pressure; Dyspnea on exertion; Fatigue; Abdominal pain affecting pregnancy; Monilial vulvovaginitis; UTI (urinary tract infection) during pregnancy, third trimester; Gestational hypertension; and Postpartum care following vaginal delivery on their problem list.  Chief Complaint  Patient presents with   Annual Exam   Contraception    Patient reports to clinic today desiring a physical and an IUD.    Patient denies any signs and symptoms.     Body mass index is 43.73 kg/m. - Patient is eligible for diabetes screening based on BMI and age >36?  not applicable HA1C ordered? not applicable  Patient reports 1  partner/s in last year. Desires STI screening?  No - patient declines.   Has patient been screened once for HCV in the past?  No  No results found for: HCVAB  Does the patient have current drug use (including MJ), have a partner with drug use, and/or has been incarcerated since last result? No  If yes-- Screen for HCV through [facility]   Does the patient meet criteria for HBV testing? No  Criteria:  -Household, sexual or needle sharing contact with HBV -History of drug use -HIV positive -Those with known Hep C   Health Maintenance Due  Topic Date Due   COVID-19 Vaccine (1) Never done   Hepatitis C Screening  Never  done   INFLUENZA VACCINE  03/14/2021   PAP-Cervical Cytology Screening  Never done   PAP SMEAR-Modifier  Never done   TETANUS/TDAP  08/17/2021    Review of Systems  Constitutional:  Negative for chills, fever, malaise/fatigue and weight loss.  HENT:  Negative for congestion, hearing loss and sore throat.   Eyes:  Negative for blurred vision, double vision and photophobia.  Respiratory:  Negative for shortness of breath.   Cardiovascular:  Negative for chest pain.  Gastrointestinal:  Negative for abdominal pain, blood in stool, constipation, diarrhea, heartburn, nausea and vomiting.  Genitourinary:  Negative for dysuria and frequency.  Musculoskeletal:  Negative for back pain, joint pain and neck pain.  Skin:  Negative for itching and rash.  Neurological:  Negative for dizziness, weakness and headaches.  Endo/Heme/Allergies:  Does not bruise/bleed easily.  Psychiatric/Behavioral:  Negative for depression, substance abuse and suicidal ideas.    The following portions of the patient's history were reviewed and updated as appropriate: allergies, current medications, past family history, past medical history, past social history, past surgical history and problem list. Problem list updated.   See flowsheet for other program required questions.  Objective:   Vitals:   09/22/21 0855  BP: 135/83  Weight: 262 lb 12.8 oz (119.2 kg)  Height: 5\' 5"  (1.651 m)    Physical Exam Constitutional:      Appearance: She is obese.  HENT:     Head: Normocephalic.     Nose: Nose normal.     Mouth/Throat:     Mouth: Mucous membranes  are moist.     Comments: Dental caries noted.  Last dental exam several years ago.   Eyes:     Pupils: Pupils are equal, round, and reactive to light.  Cardiovascular:     Rate and Rhythm: Normal rate and regular rhythm.  Pulmonary:     Effort: Pulmonary effort is normal.     Breath sounds: Normal breath sounds.  Chest:     Comments: Breasts:        Right:  Normal. No swelling, mass, nipple discharge, skin change or tenderness.        Left: Normal. No swelling, mass, nipple discharge, skin change or tenderness.   Abdominal:     General: Abdomen is flat. Bowel sounds are normal.     Palpations: Abdomen is soft.  Genitourinary:    Comments: External genitalia/pubic area without nits, lice, edema, erythema, lesions and inguinal adenopathy. Vagina with normal mucosa and discharge. Cervix without visible lesions. Uterus firm, mobile, nt, no masses, no CMT, no adnexal tenderness or fullness. Currently on menses.  Musculoskeletal:     Cervical back: Full passive range of motion without pain, normal range of motion and neck supple.  Skin:    General: Skin is warm and dry.  Neurological:     Mental Status: She is alert and oriented to person, place, and time.  Psychiatric:        Attention and Perception: Attention normal.        Mood and Affect: Mood normal.        Speech: Speech normal.        Behavior: Behavior is cooperative.      Assessment and Plan:  Tanya Mckee is a 21 y.o. female presenting to the [facility] Department for an initial annual wellness/contraceptive visit  Contraception counseling: Reviewed all forms of birth control options in the tiered based approach. available including abstinence; over the counter/barrier methods; hormonal contraceptive medication including pill, patch, ring, injection,contraceptive implant, ECP; hormonal and nonhormonal IUDs; permanent sterilization options including vasectomy and the various tubal sterilization modalities. Risks, benefits, and typical effectiveness rates were reviewed.  Questions were answered.  Written information was also given to the patient to review.  Patient desires IUD or IUS, this was prescribed for patient.    The patient will follow up in  1 year for surveillance.  The patient was told to call with any further questions, or with any concerns about this method of  contraception.  Emphasized use of condoms 100% of the time for STI prevention.  Patient was not offered ECP based on last sex.    1. Family planning counseling -21 year old female in clinic today for a physical and desires a birth control method.   -Patient declines all STD testing.  LMP started on 09/18/21.  Patient states she last had vaginal sex in 08/2021 and oral sex 09/17/21.  She desires an IUD today, see IUD consult and flowsheet.   - IGP, rfx Aptima HPV ASCU  2. Well woman exam with routine gynecological exam -CBE performed today, next due 09/2024 -PAP today, will await results -IGP, rfx Aptima HPV ASCU     Return in about 1 year (around 09/22/2022) for Annual well-woman exam.   Glenna Fellows, FNP

## 2021-09-22 NOTE — Progress Notes (Signed)
Pt here for PE, Pap and BC.   IUD counseling completed and consent forms signed. Mirena inserted without any complications. Berdie Ogren, RN

## 2021-09-22 NOTE — Progress Notes (Signed)
° ° °  Patient presented to [facility] for IUD insertion. She is not pregnant.  See Flowsheet for IUD check list  IUD Insertion Procedure Note Patient identified, informed consent performed, consent signed.   Discussed risks of irregular bleeding, cramping, infection, malpositioning or misplacement of the IUD outside the uterus which Stankiewicz require further procedure such as laparoscopy. Time out was performed.    Speculum placed in the vagina.  Cervix visualized.  Cleaned with Betadine x 2.  Grasped anteriorly with a single tooth tenaculum.  Uterus sounded to 8 cm.  IUD placed per manufacturer's recommendations.  Strings trimmed to 3 cm. Tenaculum was removed, good hemostasis noted.  Patient tolerated procedure well.   Patient was given post-procedure instructions- both agency handout and verbally by provider.  She was advised to have backup contraception for one week.  Patient was also asked to check IUD strings periodically or follow up in 4 weeks for IUD check.

## 2021-09-27 ENCOUNTER — Telehealth: Payer: Self-pay | Admitting: Family Medicine

## 2021-09-27 NOTE — Telephone Encounter (Signed)
Pt has minimal vaginal bleeding.  C/O cramping.  Takes Tylenol ES 2 tablets every 6 hours and it is not helping.  Pt unable to take Ibuprofen or Aleve.  Rate pain 8/10 during the day and 5/10 at night.  Pt has tried applying heat to the area.  Pt informed to go to the ED to rule out perforation.

## 2021-09-27 NOTE — Telephone Encounter (Signed)
Pt has a question about her birth control and some cramping that she is having. Please call.

## 2021-09-29 LAB — IGP, RFX APTIMA HPV ASCU: PAP Smear Comment: 0

## 2021-09-30 ENCOUNTER — Telehealth: Payer: Self-pay | Admitting: Family Medicine

## 2021-09-30 NOTE — Telephone Encounter (Signed)
Pt would like to speak to a nurse about BC/IUD.

## 2021-11-13 ENCOUNTER — Emergency Department
Admission: EM | Admit: 2021-11-13 | Discharge: 2021-11-13 | Disposition: A | Discharge status: Home / Self Care | Payer: Medicaid Other | Attending: Emergency Medicine | Admitting: Emergency Medicine

## 2021-11-13 ENCOUNTER — Encounter: Payer: Self-pay | Admitting: Emergency Medicine

## 2021-11-13 ENCOUNTER — Other Ambulatory Visit: Payer: Self-pay

## 2021-11-13 DIAGNOSIS — H9201 Otalgia, right ear: Secondary | ICD-10-CM | POA: Diagnosis not present

## 2021-11-13 DIAGNOSIS — Z5321 Procedure and treatment not carried out due to patient leaving prior to being seen by health care provider: Secondary | ICD-10-CM | POA: Insufficient documentation

## 2021-11-13 NOTE — ED Notes (Signed)
Patient to waiting room via wheelchair by EMS.  EMS reports right ear pain x 1 week.  EMS intervention -- p 65, bp 154/125, pulse oxi 100% on room air. ?

## 2021-11-13 NOTE — ED Triage Notes (Signed)
Pt called from [place] to treatment room, pt sitting in corner of WR and states she does not want to be seen. Pt reports her ride is on the way. RN encouraged pt to be seen but pt refuses stating she is leaving.  ?

## 2021-11-13 NOTE — ED Triage Notes (Signed)
See first RN note, pt c/o R ear pain 10/10 at this time.  ?

## 2021-11-13 NOTE — ED Notes (Signed)
Called. No pt found ?

## 2021-12-08 NOTE — Progress Notes (Signed)
PAP letter mailed today. Repeat PAP due 12-2021 r/t an Unsatisfactory PAP on 09-22-2021. Hart Carwin, RN ? ?

## 2021-12-26 ENCOUNTER — Encounter: Payer: Self-pay | Admitting: Emergency Medicine

## 2021-12-26 DIAGNOSIS — H9202 Otalgia, left ear: Secondary | ICD-10-CM | POA: Diagnosis not present

## 2021-12-26 DIAGNOSIS — Z20822 Contact with and (suspected) exposure to covid-19: Secondary | ICD-10-CM | POA: Diagnosis not present

## 2021-12-26 DIAGNOSIS — Z5321 Procedure and treatment not carried out due to patient leaving prior to being seen by health care provider: Secondary | ICD-10-CM | POA: Insufficient documentation

## 2021-12-26 DIAGNOSIS — J029 Acute pharyngitis, unspecified: Secondary | ICD-10-CM | POA: Insufficient documentation

## 2021-12-26 LAB — APTT: aPTT: 35 seconds (ref 24–36)

## 2021-12-26 LAB — CBC WITH DIFFERENTIAL/PLATELET
Abs Immature Granulocytes: 0.04 10*3/uL (ref 0.00–0.07)
Basophils Absolute: 0 10*3/uL (ref 0.0–0.1)
Basophils Relative: 0 %
Eosinophils Absolute: 0.2 10*3/uL (ref 0.0–0.5)
Eosinophils Relative: 2 %
HCT: 37.5 % (ref 36.0–46.0)
Hemoglobin: 11.5 g/dL — ABNORMAL LOW (ref 12.0–15.0)
Immature Granulocytes: 0 %
Lymphocytes Relative: 10 %
Lymphs Abs: 1.1 10*3/uL (ref 0.7–4.0)
MCH: 25.7 pg — ABNORMAL LOW (ref 26.0–34.0)
MCHC: 30.7 g/dL (ref 30.0–36.0)
MCV: 83.9 fL (ref 80.0–100.0)
Monocytes Absolute: 0.6 10*3/uL (ref 0.1–1.0)
Monocytes Relative: 6 %
Neutro Abs: 9.2 10*3/uL — ABNORMAL HIGH (ref 1.7–7.7)
Neutrophils Relative %: 82 %
Platelets: 465 10*3/uL — ABNORMAL HIGH (ref 150–400)
RBC: 4.47 MIL/uL (ref 3.87–5.11)
RDW: 13.7 % (ref 11.5–15.5)
WBC: 11.1 10*3/uL — ABNORMAL HIGH (ref 4.0–10.5)
nRBC: 0 % (ref 0.0–0.2)

## 2021-12-26 LAB — RESP PANEL BY RT-PCR (FLU A&B, COVID) ARPGX2
Influenza A by PCR: NEGATIVE
Influenza B by PCR: NEGATIVE
SARS Coronavirus 2 by RT PCR: NEGATIVE

## 2021-12-26 LAB — POC URINE PREG, ED: Preg Test, Ur: NEGATIVE

## 2021-12-26 LAB — PROTIME-INR
INR: 1 (ref 0.8–1.2)
Prothrombin Time: 12.6 seconds (ref 11.4–15.2)

## 2021-12-26 LAB — BASIC METABOLIC PANEL
Anion gap: 8 (ref 5–15)
BUN: 15 mg/dL (ref 6–20)
CO2: 26 mmol/L (ref 22–32)
Calcium: 9.2 mg/dL (ref 8.9–10.3)
Chloride: 103 mmol/L (ref 98–111)
Creatinine, Ser: 0.61 mg/dL (ref 0.44–1.00)
GFR, Estimated: >60 mL/min (ref >60)
Glucose, Bld: 109 mg/dL — ABNORMAL HIGH (ref 70–99)
Potassium: 4.1 mmol/L (ref 3.5–5.1)
Sodium: 137 mmol/L (ref 135–145)

## 2021-12-26 LAB — LACTIC ACID, PLASMA: Lactic Acid, Venous: 0.7 mmol/L (ref 0.5–1.9)

## 2021-12-26 LAB — GROUP A STREP BY PCR: Group A Strep by PCR: NOT DETECTED

## 2021-12-26 NOTE — ED Notes (Addendum)
Pt discussed with EDP (Z. Smith) see orders for intervention.  ?

## 2021-12-26 NOTE — ED Triage Notes (Addendum)
Pt presents via POV with complaints a sore throat with associated left ear pain for the last 1-2 weeks. Pt declines speaking due to increased pain when speaking. Denies SOB or CP.  ?

## 2021-12-27 ENCOUNTER — Encounter: Payer: Self-pay | Admitting: Emergency Medicine

## 2021-12-27 ENCOUNTER — Other Ambulatory Visit: Payer: Self-pay

## 2021-12-27 ENCOUNTER — Emergency Department
Admission: EM | Admit: 2021-12-27 | Discharge: 2021-12-27 | Disposition: A | Discharge status: Home / Self Care | Payer: Medicaid Other | Attending: Emergency Medicine | Admitting: Emergency Medicine

## 2021-12-27 ENCOUNTER — Ambulatory Visit
Admission: EM | Admit: 2021-12-27 | Discharge: 2021-12-27 | Disposition: A | Discharge status: Home / Self Care | Payer: Medicaid Other | Attending: Internal Medicine | Admitting: Internal Medicine

## 2021-12-27 DIAGNOSIS — H6692 Otitis media, unspecified, left ear: Secondary | ICD-10-CM | POA: Diagnosis present

## 2021-12-27 DIAGNOSIS — J029 Acute pharyngitis, unspecified: Secondary | ICD-10-CM | POA: Diagnosis not present

## 2021-12-27 LAB — URINALYSIS, COMPLETE (UACMP) WITH MICROSCOPIC
Bacteria, UA: NONE SEEN
Bilirubin Urine: NEGATIVE
Glucose, UA: NEGATIVE mg/dL
Hgb urine dipstick: NEGATIVE
Ketones, ur: NEGATIVE mg/dL
Nitrite: NEGATIVE
Protein, ur: 30 mg/dL — AB
Specific Gravity, Urine: 1.027 (ref 1.005–1.030)
pH: 7 (ref 5.0–8.0)

## 2021-12-27 LAB — GROUP A STREP BY PCR: Group A Strep by PCR: NOT DETECTED

## 2021-12-27 MED ORDER — AMOXICILLIN-POT CLAVULANATE 875-125 MG PO TABS: 1.0000 | ORAL_TABLET | Freq: Two times a day (BID) | ORAL | 0 refills | Status: AC

## 2021-12-27 NOTE — Discharge Instructions (Addendum)
Symptoms and exam today are consistent with a left ear infection.  Strep test was negative today.  Prescription for amoxicillin/clavulanate (antibiotic) was sent to the pharmacy.  Push fluids and rest.  Take tylenol or advil otc as needed for fever, discomfort.  Eat fruits and vegetables to help your immune system do its best work.  Anticipate gradual improvement over the next several days.  Recheck for new fever >100.5, increasing phlegm production/nasal discharge, or if not starting to improve in a few days.    ?

## 2021-12-27 NOTE — ED Notes (Signed)
No answer when called several times from lobby 

## 2021-12-27 NOTE — ED Triage Notes (Signed)
Patient went to ED 5/16.  Patient reports having some tests done, but left prior to seeing a provider.  Patient started feeling bad 3 days ago.  Patient has sore throat, ear pain, coughing ?

## 2021-12-27 NOTE — ED Provider Notes (Signed)
[facility]    CSN: [id number] Arrival date & time: 12/27/21  4403      History   Chief Complaint Chief Complaint  Patient presents with   Sore Throat    HPI Cloee Dunwoody Mckee is a 21 y.o. female. She presents today with 2 to 3-day history of very sore throat, cough with posttussive emesis, runny and congested nose.  No fever that she knows of.  Not having diarrhea.  Throat is so sore that it hurts to talk.  She communicates during our encounter by typing on her phone.    She went to the emergency room sometime in the last 24 hours, and had CBC and BMP done, which were unremarkable; tests for COVID and flu A/flu B were negative as well.   Sore Throat   Past Medical History:  Diagnosis Date   ADD (attention deficit disorder)    ADHD (attention deficit hyperactivity disorder)    Anxiety    Asthma    Depression    GERD (gastroesophageal reflux disease)    Hyperlipidemia    OCD (obsessive compulsive disorder)     Patient Active Problem List   Diagnosis Date Noted   Sore throat 12/27/2021   Postpartum care following vaginal delivery 07/19/2017   Gestational hypertension 07/16/2017   Abdominal pain affecting pregnancy 05/29/2017   Monilial vulvovaginitis 05/29/2017   UTI (urinary tract infection) during pregnancy, third trimester 05/29/2017   Dyspnea on exertion 10/20/2015   Fatigue 10/20/2015   Elevated blood pressure 06/16/2015   Attention deficit disorder with hyperactivity 05/21/2015   Allergic rhinitis 05/21/2015   Asthma 05/21/2015   GERD (gastroesophageal reflux disease) 05/21/2015   HLD (hyperlipidemia) 05/21/2015   Extreme obesity 05/21/2015   Apnea, sleep 05/21/2015    Past Surgical History:  Procedure Laterality Date   ORIF ANKLE FRACTURE Right 02/11/2016   Procedure: OPEN REDUCTION INTERNAL FIXATION (ORIF) ANKLE FRACTURE;  Surgeon: Gwyneth Revels, DPM;  Location: [facility] ORS;  Service: Podiatry;  Laterality: Right;   TONSILLECTOMY AND ADENOIDECTOMY   2007   UPPER GI ENDOSCOPY  10/08/2009   Possible Esophagitis    OB History     Gravida  2   Para  1   Term  1   Preterm  0   AB  0   Living  1      SAB  0   IAB  0   Ectopic  0   Multiple  0   Live Births  1            Home Medications    Prior to Admission medications   Medication Sig Start Date End Date Taking? Authorizing Provider  amoxicillin-clavulanate (AUGMENTIN) 875-125 MG tablet Take 1 tablet by mouth 2 (two) times daily for 10 days. 12/27/21 01/06/22 Yes Isa Rankin, MD  NON FORMULARY Nyquil Dayquil theraflu   Yes [provider]  acetaminophen (TYLENOL) 325 MG tablet Take 2 tablets (650 mg total) by mouth every 4 (four) hours as needed (for pain scale < 4). 07/21/17   Schuman, Jaquelyn Bitter, MD  dicyclomine (BENTYL) 10 MG capsule Take 1 capsule (10 mg total) by mouth 3 (three) times daily as needed for up to 14 days for spasms. 01/20/21 02/03/21  Phineas Semen, MD  dicyclomine (BENTYL) 10 MG capsule Take 1 capsule (10 mg total) by mouth 3 (three) times daily as needed for up to 14 days for spasms (abd cramping). 02/17/21 03/03/21  Delton Prairie, MD  pantoprazole (PROTONIX) 40 MG tablet  Take 1 tablet (40 mg total) by mouth daily. 11/21/17 11/21/18  Jeanmarie PlantMcShane, James A, MD  albuterol (VENTOLIN HFA) 108 (90 Base) MCG/ACT inhaler Inhale 2 puffs into the lungs every 6 (six) hours as needed for wheezing or shortness of breath. Patient not taking: Reported on 07/15/2020 08/20/19 09/24/20  Nita SickleVeronese, [place], MD  budesonide-formoterol [facility](SYMBICORT) 160-4.5 MCG/ACT inhaler Inhale 2 puffs into the lungs 2 (two) times daily. Patient not taking: Reported on 07/15/2020  09/24/20  [provider]  ferrous sulfate 325 (65 FE) MG EC tablet Take 325 mg by mouth daily with breakfast. Patient not taking: Reported on 07/15/2020  09/24/20  [provider]  labetalol (NORMODYNE) 200 MG tablet Take 1 tablet (200 mg total) by mouth 2 (two) times daily. Patient  not taking: Reported on 07/15/2020 07/21/17 09/24/20  Natale MilchSchuman, Christanna R, MD    Family History Family History  Problem Relation Age of Onset   Seizures Mother    Stroke Mother    Phillips OdorWolff Parkinson White syndrome Father    Breast cancer Maternal Grandmother    Lupus Maternal Grandmother    Heart attack Maternal Grandfather    CAD Maternal Grandfather        has stent   Bone cancer Maternal Grandfather    Kidney failure Paternal Grandmother    Hypertension Paternal Grandfather    CVA Paternal Grandfather    Diabetes Other    Diabetes Other    Breast cancer Other    Epilepsy Maternal Uncle     Social History Social History   Tobacco Use   Smoking status: Never   Smokeless tobacco: Never  Vaping Use   Vaping Use: Every day   Substances: Nicotine, Flavoring  Substance Use Topics   Alcohol use: No    Alcohol/week: 0.0 standard drinks   Drug use: No     Allergies   Aleve [naproxen], Aspirin, Cetirizine, Ibuprofen, Latex, Shellfish allergy, and Other   Review of Systems Review of Systems see HPI   Physical Exam Triage Vital Signs ED Triage Vitals [12/27/21 1119]  Enc Vitals Group     BP      Pulse      Resp      Temp      Temp src      SpO2      Weight      Height      Pain Score 8     Pain Loc     Updated Vital Signs BP 123/69 (BP Location: Left Arm)   Pulse (!) 108   Temp 98.5 F (36.9 C) (Oral)   Resp (!) 22   LMP 12/19/2021 (Exact Date)   SpO2 99%   Physical Exam Constitutional:      General: She is not in acute distress.    Appearance: She is ill-appearing. She is not toxic-appearing.     Comments: Good hygiene  HENT:     Head: Atraumatic.     Comments: Bilateral TMs are moderately dull, left TM is red, right TM is pearly obscured by wax but is pink flushed. Moderate nasal congestion bilaterally with mucopurulent material present Posterior pharynx is moderately injected    Mouth/Throat:     Mouth: Mucous membranes are moist.  Eyes:      Conjunctiva/sclera:     Right eye: Right conjunctiva is not injected. No exudate.    Left eye: Left conjunctiva is not injected. No exudate.    Comments:  Conjugate gaze observed  Cardiovascular:  Rate and Rhythm: Regular rhythm. Tachycardia present.  Pulmonary:     Effort: Pulmonary effort is normal. No respiratory distress.     Breath sounds: No wheezing or rhonchi.  Abdominal:     General: There is no distension.  Musculoskeletal:     Cervical back: Neck supple.     Comments: Walked into the urgent care independently  Skin:    General: Skin is warm and dry.     Comments: Pink, no cyanosis  Neurological:     Mental Status: She is alert.     Comments: Face symmetric     UC Treatments / Results  Labs (all labs ordered are listed, but only abnormal results are displayed) Labs Reviewed  GROUP A STREP BY PCR  Strep swab was negative  EKG NA  Radiology No results found. NA  Procedures Procedures (including critical care time) NA  Medications Ordered in UC Medications - No data to display NA   Final Clinical Impressions(s) / UC Diagnoses   Final diagnoses:  Sore throat  Acute left otitis media     Discharge Instructions      Symptoms and exam today are consistent with a left ear infection.  Strep test was negative today.  Prescription for amoxicillin/clavulanate (antibiotic) was sent to the pharmacy.  Push fluids and rest.  Take tylenol or advil otc as needed for fever, discomfort.  Eat fruits and vegetables to help your immune system do its best work.  Anticipate gradual improvement over the next several days.  Recheck for new fever >100.5, increasing phlegm production/nasal discharge, or if not starting to improve in a few days.        ED Prescriptions     Medication Sig Dispense Auth. Provider   amoxicillin-clavulanate (AUGMENTIN) 875-125 MG tablet Take 1 tablet by mouth 2 (two) times daily for 10 days. 20 tablet Isa Rankin, MD      PDMP  not reviewed this encounter.   Isa Rankin, MD 12/29/21 [phone]

## 2021-12-29 ENCOUNTER — Telehealth: Payer: Self-pay

## 2021-12-29 NOTE — Telephone Encounter (Signed)
Telephone call to patient today regarding her repeat PAP due 12-2021.  Patient said she needed to call back.  She will call [phone] to schedule her PAP.  Hart Carwin, RN

## 2021-12-31 LAB — CULTURE, BLOOD (ROUTINE X 2)
Culture: NO GROWTH
Culture: NO GROWTH
Special Requests: ADEQUATE
Special Requests: ADEQUATE

## 2022-01-03 NOTE — Telephone Encounter (Signed)
Telephone call to patient today regarding the need for a PAP only (physical done 09-22-2021).  Left a message for patient to call the appointment line number at [phone] to schedule the appointment. Hart Carwin, RN

## 2022-01-03 NOTE — Telephone Encounter (Signed)
MyChart message sent to patient today regarding the need for a repeat Pap only due to an Unsatisfactory PAP 09-2021. Dahlia Bailiff, RN

## 2022-01-06 NOTE — Telephone Encounter (Signed)
Close to PAP f/u until patient initiates contact with [facility].  No response to PAP letter mailed on 12-08-2021 nor MyChart message sent 01-03-2022 or message. Hart Carwin, RN

## 2022-09-19 ENCOUNTER — Ambulatory Visit: Payer: Medicaid Other

## 2022-10-25 NOTE — Congregational Nurse Program (Signed)
  Dept: [phone]   Congregational Nurse Program Note  Date of Encounter: 10/25/2022 Client to [facility] reporting she was now back in [place] and homeless. She is hoping to look at a boarding room this week. She is employed at [employer] and has been since December. She is currently staying with friends. No immediate needs at this time. Ronn Melena BSN, RN Past Medical History: Past Medical History:  Diagnosis Date   ADD (attention deficit disorder)    ADHD (attention deficit hyperactivity disorder)    Anxiety    Asthma    Depression    GERD (gastroesophageal reflux disease)    Hyperlipidemia    OCD (obsessive compulsive disorder)     Encounter Details:  CNP Questionnaire - 10/25/22 1005       Questionnaire   Ask client: Do you give verbal consent for me to treat you today? Yes    Student Assistance N/A    Location Patient [place]    Visit Setting with Client Organization    Patient Status Unhoused    Insurance Medicaid   Well care   Insurance/Financial Assistance Referral N/A    Medication N/A    Medical Provider No   client reports she goes to urgent care   Screening Referrals Made N/A    Medical Referrals Made N/A    Medical Appointment Made N/A    Recently w/o PCP, now 1st time PCP visit completed due to CNs referral or appointment made N/A    Food Have Food Insecurities    Transportation N/A    Housing/Utilities No permanent housing    Interpersonal Safety N/A    Interventions Advocate/Support;Case Management    Abnormal to Normal Screening Since Last CN Visit N/A    Screenings CN Performed N/A    Sent Client to Lab for: N/A    Did client attend any of the following based off CNs referral or appointments made? N/A    ED Visit Averted N/A    Life-Saving Intervention Made N/A

## 2022-10-29 ENCOUNTER — Other Ambulatory Visit: Payer: Self-pay

## 2022-10-29 ENCOUNTER — Emergency Department
Admission: EM | Admit: 2022-10-29 | Discharge: 2022-10-29 | Disposition: A | Discharge status: Home / Self Care | Payer: Medicaid Other | Attending: Emergency Medicine | Admitting: Emergency Medicine

## 2022-10-29 DIAGNOSIS — R1010 Upper abdominal pain, unspecified: Secondary | ICD-10-CM | POA: Insufficient documentation

## 2022-10-29 DIAGNOSIS — R112 Nausea with vomiting, unspecified: Secondary | ICD-10-CM | POA: Insufficient documentation

## 2022-10-29 DIAGNOSIS — J45909 Unspecified asthma, uncomplicated: Secondary | ICD-10-CM | POA: Insufficient documentation

## 2022-10-29 LAB — COMPREHENSIVE METABOLIC PANEL
ALT: 14 U/L (ref 0–44)
AST: 14 U/L — ABNORMAL LOW (ref 15–41)
Albumin: 4.3 g/dL (ref 3.5–5.0)
Alkaline Phosphatase: 54 U/L (ref 38–126)
Anion gap: 4 — ABNORMAL LOW (ref 5–15)
BUN: 15 mg/dL (ref 6–20)
CO2: 26 mmol/L (ref 22–32)
Calcium: 8.9 mg/dL (ref 8.9–10.3)
Chloride: 103 mmol/L (ref 98–111)
Creatinine, Ser: 0.7 mg/dL (ref 0.44–1.00)
GFR, Estimated: >60 mL/min (ref >60)
Glucose, Bld: 95 mg/dL (ref 70–99)
Potassium: 3.4 mmol/L — ABNORMAL LOW (ref 3.5–5.1)
Sodium: 133 mmol/L — ABNORMAL LOW (ref 135–145)
Total Bilirubin: 0.7 mg/dL (ref 0.3–1.2)
Total Protein: 7.7 g/dL (ref 6.5–8.1)

## 2022-10-29 LAB — CBC
HCT: 40.7 % (ref 36.0–46.0)
Hemoglobin: 13.1 g/dL (ref 12.0–15.0)
MCH: 28.3 pg (ref 26.0–34.0)
MCHC: 32.2 g/dL (ref 30.0–36.0)
MCV: 87.9 fL (ref 80.0–100.0)
Platelets: 324 10*3/uL (ref 150–400)
RBC: 4.63 MIL/uL (ref 3.87–5.11)
RDW: 12.3 % (ref 11.5–15.5)
WBC: 5.5 10*3/uL (ref 4.0–10.5)
nRBC: 0 % (ref 0.0–0.2)

## 2022-10-29 LAB — HCG, QUANTITATIVE, PREGNANCY: hCG, Beta Chain, Quant, S: <1 m[IU]/mL (ref <5)

## 2022-10-29 LAB — LIPASE, BLOOD: Lipase: 36 U/L (ref 11–51)

## 2022-10-29 MED ORDER — DICYCLOMINE HCL 10 MG PO CAPS
10.0000 mg | ORAL_CAPSULE | Freq: Once | ORAL | Status: AC
  Administered 2022-10-29: 10 mg via ORAL
  Filled 2022-10-29: qty 1

## 2022-10-29 MED ORDER — PANTOPRAZOLE SODIUM 40 MG IV SOLR
40.0000 mg | Freq: Once | INTRAVENOUS | Status: AC
  Administered 2022-10-29: 40 mg via INTRAVENOUS
  Filled 2022-10-29: qty 10

## 2022-10-29 MED ORDER — DICYCLOMINE HCL 20 MG PO TABS: 20.0000 mg | ORAL_TABLET | Freq: Three times a day (TID) | ORAL | 0 refills | Status: DC | PRN

## 2022-10-29 MED ORDER — SODIUM CHLORIDE 0.9 % IV BOLUS (SEPSIS)
1000.0000 mL | Freq: Once | INTRAVENOUS | Status: AC
Start: 2022-10-29 — End: 2022-10-29
  Administered 2022-10-29: 1000 mL via INTRAVENOUS

## 2022-10-29 MED ORDER — ONDANSETRON 4 MG PO TBDP: 4.0000 mg | ORAL_TABLET | Freq: Four times a day (QID) | ORAL | 0 refills | Status: DC | PRN

## 2022-10-29 MED ORDER — ONDANSETRON HCL 4 MG/2ML IJ SOLN
4.0000 mg | Freq: Once | INTRAMUSCULAR | Status: AC
  Administered 2022-10-29: 4 mg via INTRAVENOUS
  Filled 2022-10-29: qty 2

## 2022-10-29 NOTE — ED Provider Notes (Signed)
[facility] Provider Note    Event Date/Time   First MD Initiated Contact with Patient 10/29/22 [phone]     (approximate)   History   Abdominal Pain   HPI  Tanya Mckee is a 22 y.o. female history of obesity, GERD, hyperlipidemia, OCD, anxiety, ADHD who presents to the emergency department with complaints of nausea and vomiting today.  No diarrhea.  No fevers, dysuria, hematuria, vaginal bleeding or discharge.  Complaining of some upper abdominal pain.  No previous abdominal surgery.   History provided by patient.    Past Medical History:  Diagnosis Date   ADD (attention deficit disorder)    ADHD (attention deficit hyperactivity disorder)    Anxiety    Asthma    Depression    GERD (gastroesophageal reflux disease)    Hyperlipidemia    OCD (obsessive compulsive disorder)     Past Surgical History:  Procedure Laterality Date   ORIF ANKLE FRACTURE Right 02/11/2016   Procedure: OPEN REDUCTION INTERNAL FIXATION (ORIF) ANKLE FRACTURE;  Surgeon: Samara Deist, DPM;  Location: [facility] ORS;  Service: Podiatry;  Laterality: Right;   TONSILLECTOMY AND ADENOIDECTOMY  2007   UPPER GI ENDOSCOPY  10/08/2009   Possible Esophagitis    MEDICATIONS:  Prior to Admission medications   Medication Sig Start Date End Date Taking? Authorizing Provider  acetaminophen (TYLENOL) 325 MG tablet Take 2 tablets (650 mg total) by mouth every 4 (four) hours as needed (for pain scale < 4). 07/21/17   Schuman, Stefanie Libel, MD  dicyclomine (BENTYL) 10 MG capsule Take 1 capsule (10 mg total) by mouth 3 (three) times daily as needed for up to 14 days for spasms. 01/20/21 02/03/21  Nance Pear, MD  dicyclomine (BENTYL) 10 MG capsule Take 1 capsule (10 mg total) by mouth 3 (three) times daily as needed for up to 14 days for spasms (abd cramping). 02/17/21 03/03/21  Vladimir Crofts, MD  NON FORMULARY Nyquil Dayquil theraflu    [provider]  pantoprazole (PROTONIX) 40 MG tablet  Take 1 tablet (40 mg total) by mouth daily. 11/21/17 11/21/18  Schuyler Amor, MD  albuterol (VENTOLIN HFA) 108 (90 Base) MCG/ACT inhaler Inhale 2 puffs into the lungs every 6 (six) hours as needed for wheezing or shortness of breath. Patient not taking: Reported on 07/15/2020 08/20/19 09/24/20  Rudene Re, MD  budesonide-formoterol [facility]) 160-4.5 MCG/ACT inhaler Inhale 2 puffs into the lungs 2 (two) times daily. Patient not taking: Reported on 07/15/2020  09/24/20  [provider]  ferrous sulfate 325 (65 FE) MG EC tablet Take 325 mg by mouth daily with breakfast. Patient not taking: Reported on 07/15/2020  09/24/20  [provider]  labetalol (NORMODYNE) 200 MG tablet Take 1 tablet (200 mg total) by mouth 2 (two) times daily. Patient not taking: Reported on 07/15/2020 07/21/17 09/24/20  Homero Fellers, MD    Physical Exam   Triage Vital Signs: ED Triage Vitals  Enc Vitals Group     BP 10/29/22 0124 138/86     Pulse Rate 10/29/22 0124 75     Resp 10/29/22 0124 16     Temp 10/29/22 0124 98.1 F (36.7 C)     Temp Source 10/29/22 0124 Oral     SpO2 10/29/22 0124 99 %     Weight 10/29/22 0125 176 lb 5.9 oz (80 kg)     Height 10/29/22 0125 5\' 5"  (1.651 m)     Head Circumference --  Peak Flow --      Pain Score 10/29/22 0125 5     Pain Loc --      Pain Edu? --      Excl. in [place]? --     Most recent vital signs: Vitals:   10/29/22 0600 10/29/22 0630  BP: 122/77 125/88  Pulse: (!) 50 (!) 55  Resp:    Temp:    SpO2: 98% 98%    CONSTITUTIONAL: Alert, responds appropriately to questions. Well-appearing; well-nourished HEAD: Normocephalic, atraumatic EYES: Conjunctivae clear, pupils appear equal, sclera nonicteric ENT: normal nose; moist mucous membranes NECK: Supple, normal ROM CARD: RRR; S1 and S2 appreciated RESP: Normal chest excursion without splinting or tachypnea; breath sounds clear and equal bilaterally; no wheezes, no rhonchi, no rales, no  hypoxia or respiratory distress, speaking full sentences ABD/GI: Non-distended; soft, non-tender, no rebound, no guarding, no peritoneal signs, negative Murphy sign, no tenderness at McBurney's point BACK: The back appears normal EXT: Normal ROM in all joints; no deformity noted, no edema SKIN: Normal color for age and race; warm; no rash on exposed skin NEURO: Moves all extremities equally, normal speech PSYCH: The patient's mood and manner are appropriate.   ED Results / Procedures / Treatments   LABS: (all labs ordered are listed, but only abnormal results are displayed) Labs Reviewed  COMPREHENSIVE METABOLIC PANEL - Abnormal; Notable for the following components:      Result Value   Sodium 133 (*)    Potassium 3.4 (*)    AST 14 (*)    Anion gap 4 (*)    All other components within normal limits  LIPASE, BLOOD  CBC  HCG, QUANTITATIVE, PREGNANCY  URINALYSIS, ROUTINE W REFLEX MICROSCOPIC     EKG:   EKG Interpretation  Date/Time:  Sunday October 29 2022 01:29:09 EDT Ventricular Rate:  80 PR Interval:  154 QRS Duration: 90 QT Interval:  356 QTC Calculation: 410 R Axis:   92 Text Interpretation: Normal sinus rhythm Rightward axis Borderline ECG When compared with ECG of 15-[place]-2023 22:42, No significant change was found Confirmed by Pryor Curia [id number]) on 10/29/2022 6:09:53 AM         RADIOLOGY: My personal review and interpretation of imaging:    I have personally reviewed all radiology reports.   No results found.   PROCEDURES:  Critical Care performed: No      Procedures    IMPRESSION / MDM / ASSESSMENT AND PLAN / ED COURSE  I reviewed the triage vital signs and the nursing notes.    Patient here with nausea and vomiting.  Complaining of upper abdominal pain but abdominal exam is benign.    DIFFERENTIAL DIAGNOSIS (includes but not limited to):   Gastritis, GERD, viral gastroenteritis, low suspicion for cholelithiasis, cholecystitis,  pancreatitis, appendicitis, bowel obstruction   Patient's presentation is most consistent with acute complicated illness / injury requiring diagnostic workup.   PLAN: Workup initiated from triage.  No leukocytosis, normal hemoglobin.  Normal electrolytes, renal function, LFTs and lipase.  Pregnancy test is negative.  Will give IV fluids, Zofran, Protonix, Bentyl for symptomatic relief.  No indication for emergent abdominal imaging at this time as her abdominal exam is benign.   MEDICATIONS GIVEN IN ED: Medications  ondansetron (ZOFRAN) injection 4 mg (4 mg Intravenous Given 10/29/22 0553)  sodium chloride 0.9 % bolus 1,000 mL (0 mLs Intravenous Stopped 10/29/22 0642)  pantoprazole (PROTONIX) injection 40 mg (40 mg Intravenous Given 10/29/22 0554)  dicyclomine (BENTYL) capsule 10 mg (10  mg Oral Given 10/29/22 0553)     ED COURSE: Patient reports feeling better.  Tolerating p.o.  Will discharge home with Zofran, Bentyl and recommended bland diet.   At this time, I do not feel there is any life-threatening condition present. I reviewed all nursing notes, vitals, pertinent previous records.  All lab and urine results, EKGs, imaging ordered have been independently reviewed and interpreted by myself.  I reviewed all available radiology reports from any imaging ordered this visit.  Based on my assessment, I feel the patient is safe to be discharged home without further emergent workup and can continue workup as an outpatient as needed. Discussed all findings, treatment plan as well as usual and customary return precautions.  They verbalize understanding and are comfortable with this plan.  Outpatient follow-up has been provided as needed.  All questions have been answered.    CONSULTS:  none   OUTSIDE RECORDS REVIEWED: Reviewed last OB/GYN note in February 2023.       FINAL CLINICAL IMPRESSION(S) / ED DIAGNOSES   Final diagnoses:  Nausea and vomiting in adult     Rx / DC Orders   ED  Discharge Orders          Ordered    ondansetron (ZOFRAN-ODT) 4 MG disintegrating tablet  Every 6 hours PRN        10/29/22 0611    dicyclomine (BENTYL) 20 MG tablet  Every 8 hours PRN        10/29/22 [id number]             Note:  This document was prepared using Dragon voice recognition software and [place] include unintentional dictation errors.   Donielle Kaigler, Delice Bison, DO 10/29/22 [phone]

## 2022-10-29 NOTE — ED Triage Notes (Signed)
First Nurse Note:  BIB [facility] from sidewalk outside of [employer]. Pt c/o abd pain with n/v. Reports to EMS that she has been vomiting blood clots. No emesis with EMS. EMS reports pt ambulatory and alert and oriented. Seen yesterday for same.   149/95 BGL 110 98 temp 97% RA HR 85

## 2022-10-29 NOTE — ED Triage Notes (Signed)
Pt to ED from home via [facility]. Pt was seen here last night at 0130AM for same. Pt advised she threw up a blood clot is why she called 911 today. Pt has no active vomiting at this time. Pt is CAOx4 and in no acute distress and ambulatory in triage.

## 2022-10-29 NOTE — ED Triage Notes (Signed)
Pt BIB EMS, pt complaining of epigastric pain, N/V since Friday. Pt talks in complete sentences no respiratory distress noted

## 2022-10-29 NOTE — Discharge Instructions (Signed)
Please go to the following website to schedule new (and existing) patient appointments:   [email or web address]   The following is a list of primary care offices in the area who are accepting new patients at this time.  Please reach out to one of them directly and let them know you would like to schedule an appointment to follow up on an Emergency Department visit, and/or to establish a new primary care provider (PCP).  There are likely other primary care clinics in the are who are accepting new patients, but this is an excellent place to start:  [facility] Lead physician: Dr Angela Bacigalupo [address] [place], [place] [address] [phone]  [facility] Lead Physician: Dr Krichna Sowles [address], [place], [place] [address] [phone]  [facility]  Lead Physician: Dr Megan Johnson [address], [place], [place] [address] [phone]  [facility] [place] [facility] Lead Physician: Dr Alex Karamalegos [address], [place], [place] [address] [phone]  [facility] [place] Lead Physician: Dr Laura Berglund [address], [place], [place] [address] [phone]   

## 2022-10-29 NOTE — ED Triage Notes (Signed)
First nurse Note: Per EMS pt was sitting in front of the  Library [place] covered with a blanket. Per EMS pt has had intermittent N/V since Friday morning.  90HR, 98%, 139/86. Pt is ambulatory and A &O x4

## 2022-10-30 ENCOUNTER — Emergency Department
Admission: EM | Admit: 2022-10-30 | Discharge: 2022-10-30 | Disposition: A | Discharge status: Home / Self Care | Payer: Medicaid Other | Source: Home / Self Care | Attending: Emergency Medicine | Admitting: Emergency Medicine

## 2022-10-30 DIAGNOSIS — R112 Nausea with vomiting, unspecified: Secondary | ICD-10-CM

## 2022-10-30 MED ORDER — ONDANSETRON 4 MG PO TBDP
4.0000 mg | ORAL_TABLET | Freq: Once | ORAL | Status: AC
  Administered 2022-10-30: 4 mg via ORAL
  Filled 2022-10-30: qty 1

## 2022-10-30 MED ORDER — ALUM & MAG HYDROXIDE-SIMETH 200-200-20 MG/5ML PO SUSP
30.0000 mL | ORAL | Status: AC
  Administered 2022-10-30: 30 mL via ORAL
  Filled 2022-10-30: qty 30

## 2022-10-30 NOTE — Discharge Instructions (Signed)
Please pick up the prescriptions prescribed last night and establish a primary care doctor to follow-up.  Read through the discharge instructions from last night and follow any of the instructions provided at that time.  Stick with a bland diet for now.

## 2022-10-30 NOTE — ED Notes (Signed)
See triage note. Pt complaining of abdominal pain near umbilicus on both R and L sides since Friday. States N/V and had blood in vomit. Pt currently A&Ox4.

## 2022-10-30 NOTE — ED Provider Notes (Signed)
[facility] Provider Note    Event Date/Time   First MD Initiated Contact with Patient 10/30/22 0032     (approximate)   History   Abdominal Pain (X1 days)   HPI  Tanya Mckee is a 22 y.o. female who presents for evaluation of 1 episode of vomiting in which she says there was a blood clot.  She was seen about 24 hours ago for nausea and vomiting and some upper abdominal pain.  She had a reassuring exam and was prescribed Bentyl and Zofran.  She said she was unable to get the prescriptions filled because the pharmacy was closed today.  She was outside [employer] today when she vomited so she called EMS and came in.  She has had no persistent pain or nausea and no more episodes of vomiting     Physical Exam   Triage Vital Signs: ED Triage Vitals  Enc Vitals Group     BP 10/29/22 2123 (!) 140/86     Pulse Rate 10/29/22 2123 90     Resp 10/29/22 2123 16     Temp 10/29/22 2123 98.5 F (36.9 C)     Temp Source 10/30/22 0034 Oral     SpO2 10/29/22 2123 98 %     Weight 10/29/22 2124 80 kg (176 lb 5.9 oz)     Height 10/29/22 2124 1.651 m (5\' 5" )     Head Circumference --      Peak Flow --      Pain Score 10/29/22 2123 6     Pain Loc --      Pain Edu? --      Excl. in [place]? --     Most recent vital signs: Vitals:   10/30/22 0034 10/30/22 0138  BP: 122/70 120/67  Pulse: 70 80  Resp: 17 17  Temp: 97.6 F (36.4 C) 97.6 F (36.4 C)  SpO2: 100% 99%     General: Awake, no distress.  CV:  Good peripheral perfusion.  Regular rate and rhythm. Resp:  Normal effort. Speaking easily and comfortably, no accessory muscle usage nor intercostal retractions.   Abd:  No distention.  No tenderness to palpation.  No rebound or guarding.   ED Results / Procedures / Treatments   Labs (all labs ordered are listed, but only abnormal results are displayed) Labs Reviewed - No data to display    PROCEDURES:  Critical Care performed:  No  Procedures   MEDICATIONS ORDERED IN ED: Medications  ondansetron (ZOFRAN-ODT) disintegrating tablet 4 mg (4 mg Oral Given 10/30/22 0033)  ondansetron (ZOFRAN-ODT) disintegrating tablet 4 mg (4 mg Oral Given 10/30/22 0137)  alum & mag hydroxide-simeth (MAALOX/MYLANTA) 200-200-20 MG/5ML suspension 30 mL (30 mLs Oral Given 10/30/22 0137)     IMPRESSION / MDM / ASSESSMENT AND PLAN / ED COURSE  I reviewed the triage vital signs and the nursing notes.                              Differential diagnosis includes, but is not limited to, persistent nausea and gastritis, Mallory-Weiss tear, less likely pancreatitis or biliary colic.  Patient's presentation is most consistent with acute, uncomplicated illness.  Interventions/Medications given: Zofran 4 mg ODT x 2, Maalox 30 mL [facility] Course my include additional interventions or labs/studies not listed above.)  Patient is well-appearing and in no distress.  She presents less than 24 hours with similar symptoms from prior except  this time her symptoms were milder and only consisted of 1 episode of vomiting.  It seems she was concerned about some blood in her vomit but apparently it was a very small amount.  She is in no distress and has been sleeping.  Her vital signs are completely stable and has no persistent symptoms.  She received a Zofran when she arrived 4 hours ago and I ordered another 1 with some Maalox.  She said that she would fill her prescriptions.  I encouraged her to follow-up as previously recommended and I also recommended establishing a primary care doctor.  I gave my usual return precautions.       FINAL CLINICAL IMPRESSION(S) / ED DIAGNOSES   Final diagnoses:  Nausea and vomiting, unspecified vomiting type     Rx / DC Orders   ED Discharge Orders     None        Note:  This document was prepared using Dragon voice recognition software and Marulanda include unintentional dictation errors.   Hinda Kehr,  MD 10/30/22 [phone]

## 2022-11-06 ENCOUNTER — Emergency Department
Admission: EM | Admit: 2022-11-06 | Discharge: 2022-11-07 | Disposition: A | Discharge status: Home / Self Care | Payer: Medicaid Other | Attending: Emergency Medicine | Admitting: Emergency Medicine

## 2022-11-06 ENCOUNTER — Other Ambulatory Visit: Payer: Self-pay

## 2022-11-06 DIAGNOSIS — J45909 Unspecified asthma, uncomplicated: Secondary | ICD-10-CM | POA: Insufficient documentation

## 2022-11-06 DIAGNOSIS — I10 Essential (primary) hypertension: Secondary | ICD-10-CM | POA: Insufficient documentation

## 2022-11-06 DIAGNOSIS — R221 Localized swelling, mass and lump, neck: Secondary | ICD-10-CM | POA: Diagnosis present

## 2022-11-06 DIAGNOSIS — Z91040 Latex allergy status: Secondary | ICD-10-CM | POA: Insufficient documentation

## 2022-11-06 DIAGNOSIS — J029 Acute pharyngitis, unspecified: Secondary | ICD-10-CM | POA: Insufficient documentation

## 2022-11-06 DIAGNOSIS — Z20822 Contact with and (suspected) exposure to covid-19: Secondary | ICD-10-CM | POA: Diagnosis not present

## 2022-11-06 DIAGNOSIS — R591 Generalized enlarged lymph nodes: Secondary | ICD-10-CM

## 2022-11-06 HISTORY — DX: Bipolar disorder, unspecified: F31.9

## 2022-11-06 HISTORY — DX: Oppositional defiant disorder: F91.3

## 2022-11-06 MED ORDER — MAGIC MOUTHWASH
10.0000 mL | Freq: Once | ORAL | Status: AC
  Administered 2022-11-06: 10 mL via ORAL
  Filled 2022-11-06: qty 10

## 2022-11-06 MED ORDER — SODIUM CHLORIDE 0.9 % IV BOLUS
1000.0000 mL | Freq: Once | INTRAVENOUS | Status: AC
  Administered 2022-11-06: 1000 mL via INTRAVENOUS

## 2022-11-06 MED ORDER — DEXAMETHASONE 10 MG/ML FOR PEDIATRIC ORAL USE
10.0000 mg | Freq: Once | INTRAMUSCULAR | Status: AC
  Administered 2022-11-06: 10 mg via ORAL
  Filled 2022-11-06: qty 1

## 2022-11-06 NOTE — ED Provider Notes (Signed)
[facility] Provider Note    Event Date/Time   First MD Initiated Contact with Patient 11/06/22 2307     (approximate)   History   Neck mass   HPI  Tanya Mckee is a 22 y.o. female brought to the ED via EMS from home with a chief complaint of knot on the left side of her neck.  Patient first noted neck mass 4 days ago after work because she felt a sharp pain on that side.  Endorses sore throat and mild cough.  Denies fever/chills, chest pain, shortness of breath, abdominal pain, nausea, vomiting or diarrhea.     Past Medical History   Past Medical History:  Diagnosis Date   ADD (attention deficit disorder)    ADHD (attention deficit hyperactivity disorder)    Anxiety    Asthma    Depression    GERD (gastroesophageal reflux disease)    Hyperlipidemia    OCD (obsessive compulsive disorder)      Active Problem List   Patient Active Problem List   Diagnosis Date Noted   Sore throat 12/27/2021   Postpartum care following vaginal delivery 07/19/2017   Gestational hypertension 07/16/2017   Abdominal pain affecting pregnancy 05/29/2017   Monilial vulvovaginitis 05/29/2017   UTI (urinary tract infection) during pregnancy, third trimester 05/29/2017   Dyspnea on exertion 10/20/2015   Fatigue 10/20/2015   Elevated blood pressure 06/16/2015   Attention deficit disorder with hyperactivity 05/21/2015   Allergic rhinitis 05/21/2015   Asthma 05/21/2015   GERD (gastroesophageal reflux disease) 05/21/2015   HLD (hyperlipidemia) 05/21/2015   Extreme obesity 05/21/2015   Apnea, sleep 05/21/2015     Past Surgical History   Past Surgical History:  Procedure Laterality Date   ORIF ANKLE FRACTURE Right 02/11/2016   Procedure: OPEN REDUCTION INTERNAL FIXATION (ORIF) ANKLE FRACTURE;  Surgeon: Samara Deist, DPM;  Location: [facility] ORS;  Service: Podiatry;  Laterality: Right;   TONSILLECTOMY AND ADENOIDECTOMY  2007   UPPER GI ENDOSCOPY  10/08/2009    Possible Esophagitis     Home Medications   Prior to Admission medications   Medication Sig Start Date End Date Taking? Authorizing Provider  dicyclomine (BENTYL) 20 MG tablet Take 1 tablet (20 mg total) by mouth every 8 (eight) hours as needed. 10/29/22   Ward, Delice Bison, DO  ondansetron (ZOFRAN-ODT) 4 MG disintegrating tablet Take 1 tablet (4 mg total) by mouth every 6 (six) hours as needed for nausea or vomiting. 10/29/22   Ward, Delice Bison, DO  albuterol (VENTOLIN HFA) 108 (90 Base) MCG/ACT inhaler Inhale 2 puffs into the lungs every 6 (six) hours as needed for wheezing or shortness of breath. Patient not taking: Reported on 07/15/2020 08/20/19 09/24/20  Rudene Re, MD  budesonide-formoterol [facility]) 160-4.5 MCG/ACT inhaler Inhale 2 puffs into the lungs 2 (two) times daily. Patient not taking: Reported on 07/15/2020  09/24/20  [provider]  ferrous sulfate 325 (65 FE) MG EC tablet Take 325 mg by mouth daily with breakfast. Patient not taking: Reported on 07/15/2020  09/24/20  [provider]  labetalol (NORMODYNE) 200 MG tablet Take 1 tablet (200 mg total) by mouth 2 (two) times daily. Patient not taking: Reported on 07/15/2020 07/21/17 09/24/20  Homero Fellers, MD     Allergies  Aleve [naproxen], Aspirin, Cetirizine, Ibuprofen, Latex, Shellfish allergy, and Other   Family History   Family History  Problem Relation Age of Onset   Seizures Mother    Stroke Mother  Yves Dill Parkinson White syndrome Father    Breast cancer Maternal Grandmother    Lupus Maternal Grandmother    Heart attack Maternal Grandfather    CAD Maternal Grandfather        has stent   Bone cancer Maternal Grandfather    Kidney failure Paternal Grandmother    Hypertension Paternal Grandfather    CVA Paternal Grandfather    Diabetes Other    Diabetes Other    Breast cancer Other    Epilepsy Maternal Uncle      Physical Exam  Triage Vital Signs: ED Triage Vitals  Enc Vitals  Group     BP      Pulse      Resp      Temp      Temp src      SpO2      Weight      Height      Head Circumference      Peak Flow      Pain Score      Pain Loc      Pain Edu?      Excl. in [place]?     Updated Vital Signs: LMP 10/22/2022 (Approximate)    General: Awake, no distress.  CV:  RRR.  Good peripheral perfusion.  Resp:  Normal effort.  CTAB. Abd:  Nontender.  No distention.  Other:  Dull posterior oropharynx.  Patient is status post tonsillectomy.  No exudates or peritonsillar abscess noted.  There is no hoarse or muffled voice.  There is no drooling.  Shotty left cervical lymphadenopathy.   ED Results / Procedures / Treatments  Labs (all labs ordered are listed, but only abnormal results are displayed) Labs Reviewed - No data to display   EKG  None   RADIOLOGY I have independently visualized and interpreted patient's CT scan as well as noted the radiology interpretation:  CT neck:  Official radiology report(s): No results found.   PROCEDURES:  Critical Care performed: {CriticalCareYesNo:19197::"Yes, see critical care procedure note(s)","No"}  Procedures   MEDICATIONS ORDERED IN ED: Medications - No data to display   IMPRESSION / MDM / [place] / ED COURSE  I reviewed the triage vital signs and the nursing notes.                             22 year old female presenting with left neck mass.  Differential diagnosis includes but is not limited to lymphadenopathy, sialolithiasis, retropharyngeal abscess, etc.  I have personally reviewed patient's records and note a last office visit with GYN on 2/29/2023 for her well woman exam.  Patient's presentation is most consistent with acute presentation with potential threat to life or bodily function.  Will obtain basic lab work, respiratory panel, group A strep swab and proceed with CT neck.  Administer Decadron, Magic mouthwash, IV fluid hydration and reassess.      FINAL CLINICAL  IMPRESSION(S) / ED DIAGNOSES   Final diagnoses:  None     Rx / DC Orders   ED Discharge Orders     None        Note:  This document was prepared using Dragon voice recognition software and Simone include unintentional dictation errors.

## 2022-11-06 NOTE — ED Triage Notes (Signed)
Pt brought by EMS from home for a knot on left side of neck that patient noticed on Friday after work after feeling a sharp pain. Pt states she was bent over cleaning the floor when she felt the pain and felt light headed when she stood back up. Denies any lightheadedness since. Pt states the pain is just staying and she doesn't know what's wrong.

## 2022-11-07 ENCOUNTER — Emergency Department: Payer: Medicaid Other

## 2022-11-07 LAB — CBC WITH DIFFERENTIAL/PLATELET
Abs Immature Granulocytes: 0.01 10*3/uL (ref 0.00–0.07)
Basophils Absolute: 0.1 10*3/uL (ref 0.0–0.1)
Basophils Relative: 1 %
Eosinophils Absolute: 0.2 10*3/uL (ref 0.0–0.5)
Eosinophils Relative: 3 %
HCT: 38 % (ref 36.0–46.0)
Hemoglobin: 12.5 g/dL (ref 12.0–15.0)
Immature Granulocytes: 0 %
Lymphocytes Relative: 18 %
Lymphs Abs: 1.4 10*3/uL (ref 0.7–4.0)
MCH: 28.9 pg (ref 26.0–34.0)
MCHC: 32.9 g/dL (ref 30.0–36.0)
MCV: 88 fL (ref 80.0–100.0)
Monocytes Absolute: 0.6 10*3/uL (ref 0.1–1.0)
Monocytes Relative: 8 %
Neutro Abs: 5.4 10*3/uL (ref 1.7–7.7)
Neutrophils Relative %: 70 %
Platelets: 375 10*3/uL (ref 150–400)
RBC: 4.32 MIL/uL (ref 3.87–5.11)
RDW: 12.5 % (ref 11.5–15.5)
WBC: 7.6 10*3/uL (ref 4.0–10.5)
nRBC: 0 % (ref 0.0–0.2)

## 2022-11-07 LAB — BASIC METABOLIC PANEL
Anion gap: 9 (ref 5–15)
BUN: 12 mg/dL (ref 6–20)
CO2: 24 mmol/L (ref 22–32)
Calcium: 9.1 mg/dL (ref 8.9–10.3)
Chloride: 105 mmol/L (ref 98–111)
Creatinine, Ser: 0.71 mg/dL (ref 0.44–1.00)
GFR, Estimated: >60 mL/min (ref >60)
Glucose, Bld: 107 mg/dL — ABNORMAL HIGH (ref 70–99)
Potassium: 3.7 mmol/L (ref 3.5–5.1)
Sodium: 138 mmol/L (ref 135–145)

## 2022-11-07 LAB — SARS CORONAVIRUS 2 BY RT PCR: SARS Coronavirus 2 by RT PCR: NEGATIVE

## 2022-11-07 LAB — GROUP A STREP BY PCR: Group A Strep by PCR: NOT DETECTED

## 2022-11-07 MED ORDER — IOHEXOL 300 MG/ML  SOLN
75.0000 mL | Freq: Once | INTRAMUSCULAR | Status: AC | PRN
  Administered 2022-11-07: 75 mL via INTRAVENOUS

## 2022-11-07 MED ORDER — AMOXICILLIN 500 MG PO CAPS
500.0000 mg | ORAL_CAPSULE | Freq: Once | ORAL | Status: AC
  Administered 2022-11-07: 500 mg via ORAL
  Filled 2022-11-07: qty 1

## 2022-11-07 MED ORDER — AMOXICILLIN 500 MG PO CAPS: 500.0000 mg | ORAL_CAPSULE | Freq: Three times a day (TID) | ORAL | 0 refills | Status: DC

## 2022-11-07 MED ORDER — MAGIC MOUTHWASH: ORAL | 0 refills | Status: DC

## 2022-11-07 NOTE — Discharge Instructions (Addendum)
1.  Finish antibiotic as prescribed. 2.  You Olthoff take Magic mouthwash as needed for throat discomfort. 3.  Return to the ER for worsening symptoms, persistent vomiting, fever or other concerns.

## 2022-11-20 DIAGNOSIS — J45909 Unspecified asthma, uncomplicated: Secondary | ICD-10-CM | POA: Insufficient documentation

## 2022-11-20 DIAGNOSIS — K226 Gastro-esophageal laceration-hemorrhage syndrome: Secondary | ICD-10-CM | POA: Insufficient documentation

## 2022-11-20 DIAGNOSIS — R1030 Lower abdominal pain, unspecified: Secondary | ICD-10-CM | POA: Insufficient documentation

## 2022-11-20 DIAGNOSIS — N3000 Acute cystitis without hematuria: Secondary | ICD-10-CM | POA: Insufficient documentation

## 2022-11-20 DIAGNOSIS — K920 Hematemesis: Secondary | ICD-10-CM | POA: Diagnosis present

## 2022-11-20 LAB — CBC WITH DIFFERENTIAL/PLATELET
Abs Immature Granulocytes: 0.02 10*3/uL (ref 0.00–0.07)
Basophils Absolute: 0 10*3/uL (ref 0.0–0.1)
Basophils Relative: 0 %
Eosinophils Absolute: 0.2 10*3/uL (ref 0.0–0.5)
Eosinophils Relative: 2 %
HCT: 41.7 % (ref 36.0–46.0)
Hemoglobin: 13.2 g/dL (ref 12.0–15.0)
Immature Granulocytes: 0 %
Lymphocytes Relative: 13 %
Lymphs Abs: 1.2 10*3/uL (ref 0.7–4.0)
MCH: 27.8 pg (ref 26.0–34.0)
MCHC: 31.7 g/dL (ref 30.0–36.0)
MCV: 88 fL (ref 80.0–100.0)
Monocytes Absolute: 0.5 10*3/uL (ref 0.1–1.0)
Monocytes Relative: 5 %
Neutro Abs: 7.2 10*3/uL (ref 1.7–7.7)
Neutrophils Relative %: 80 %
Platelets: 438 10*3/uL — ABNORMAL HIGH (ref 150–400)
RBC: 4.74 MIL/uL (ref 3.87–5.11)
RDW: 12.3 % (ref 11.5–15.5)
WBC: 9.1 10*3/uL (ref 4.0–10.5)
nRBC: 0 % (ref 0.0–0.2)

## 2022-11-20 LAB — COMPREHENSIVE METABOLIC PANEL
ALT: 12 U/L (ref 0–44)
AST: 11 U/L — ABNORMAL LOW (ref 15–41)
Albumin: 4 g/dL (ref 3.5–5.0)
Alkaline Phosphatase: 57 U/L (ref 38–126)
Anion gap: 7 (ref 5–15)
BUN: 16 mg/dL (ref 6–20)
CO2: 25 mmol/L (ref 22–32)
Calcium: 9.1 mg/dL (ref 8.9–10.3)
Chloride: 108 mmol/L (ref 98–111)
Creatinine, Ser: 0.92 mg/dL (ref 0.44–1.00)
GFR, Estimated: >60 mL/min (ref >60)
Glucose, Bld: 103 mg/dL — ABNORMAL HIGH (ref 70–99)
Potassium: 4 mmol/L (ref 3.5–5.1)
Sodium: 140 mmol/L (ref 135–145)
Total Bilirubin: 0.3 mg/dL (ref 0.3–1.2)
Total Protein: 7.7 g/dL (ref 6.5–8.1)

## 2022-11-20 LAB — URINALYSIS, ROUTINE W REFLEX MICROSCOPIC
Bilirubin Urine: NEGATIVE
Glucose, UA: NEGATIVE mg/dL
Ketones, ur: NEGATIVE mg/dL
Nitrite: NEGATIVE
Protein, ur: 30 mg/dL — AB
Specific Gravity, Urine: 1.029 (ref 1.005–1.030)
WBC, UA: >50 WBC/hpf (ref 0–5)
pH: 5 (ref 5.0–8.0)

## 2022-11-20 LAB — POC URINE PREG, ED: Preg Test, Ur: NEGATIVE

## 2022-11-20 LAB — LIPASE, BLOOD: Lipase: 30 U/L (ref 11–51)

## 2022-11-20 NOTE — ED Triage Notes (Signed)
To triage via [facility] with c/o hematemesis after she ate today. Reports similar episodes in past, but no definitive dx. EMS reports no active vomiting during transport and Pt ambulatory without difficulty to EMS truck on scene.

## 2022-11-20 NOTE — ED Triage Notes (Signed)
Pt reports vomiting started apx 1830 this evening. States total of 4 episodes of emesis since 1830, with only 2 of those episodes were hematemesis, and " were in the middle of a bright red and dark red. " Denies eating anything that [place] have caused vomiting.  States mid abd pain as well starting today. Pt sitting on bench, texting on phone with no distress noted currently.

## 2022-11-21 ENCOUNTER — Emergency Department
Admission: EM | Admit: 2022-11-21 | Discharge: 2022-11-21 | Disposition: A | Discharge status: Home / Self Care | Payer: Medicaid Other | Attending: Emergency Medicine | Admitting: Emergency Medicine

## 2022-11-21 DIAGNOSIS — R1030 Lower abdominal pain, unspecified: Secondary | ICD-10-CM

## 2022-11-21 DIAGNOSIS — N3000 Acute cystitis without hematuria: Secondary | ICD-10-CM

## 2022-11-21 DIAGNOSIS — K226 Gastro-esophageal laceration-hemorrhage syndrome: Secondary | ICD-10-CM

## 2022-11-21 DIAGNOSIS — R112 Nausea with vomiting, unspecified: Secondary | ICD-10-CM

## 2022-11-21 MED ORDER — CEPHALEXIN 500 MG PO CAPS
500.0000 mg | ORAL_CAPSULE | Freq: Once | ORAL | Status: AC
  Administered 2022-11-21: 500 mg via ORAL
  Filled 2022-11-21: qty 1

## 2022-11-21 MED ORDER — ONDANSETRON 4 MG PO TBDP: 4.0000 mg | ORAL_TABLET | Freq: Three times a day (TID) | ORAL | 0 refills | Status: DC | PRN

## 2022-11-21 MED ORDER — PANTOPRAZOLE SODIUM 40 MG PO TBEC: 40.0000 mg | DELAYED_RELEASE_TABLET | Freq: Every day | ORAL | 0 refills | Status: DC

## 2022-11-21 MED ORDER — CEPHALEXIN 500 MG PO CAPS: 500.0000 mg | ORAL_CAPSULE | Freq: Four times a day (QID) | ORAL | 0 refills | Status: DC

## 2022-11-21 MED ORDER — PANTOPRAZOLE SODIUM 40 MG PO TBEC: 40.0000 mg | DELAYED_RELEASE_TABLET | Freq: Every day | ORAL | 0 refills | Status: AC

## 2022-11-21 MED ORDER — CEPHALEXIN 500 MG PO CAPS: 500.0000 mg | ORAL_CAPSULE | Freq: Four times a day (QID) | ORAL | 0 refills | Status: AC

## 2022-11-21 NOTE — ED Provider Notes (Signed)
[facility] Provider Note    Event Date/Time   First MD Initiated Contact with Patient 11/21/22 [phone]     (approximate)   History   Chief Complaint Hematemesis   HPI  Tanya Mckee is a 22 y.o. female with past medical history of hyperlipidemia, asthma, and GERD who presents to the ED complaining of hematemesis.  Patient reports that yesterday afternoon she developed pain in her upper abdomen associated with nausea and multiple episodes of vomiting.  She denies any associated diarrhea, states that initially she was vomiting food particles but then noticed "small clots" in her emesis.  She noticed the small amount of blood with a couple episodes of emesis, but states more recent episodes of emesis have not had any blood.  She has not recently noticed any blood in her stool or dark tarry stool, denies any history of similar symptoms.  She endorses some urinary frequency, but denies any fevers or flank pain.  She reports drinking ginger ale since arriving to the ED with no ongoing nausea or vomiting.     Physical Exam   Triage Vital Signs: ED Triage Vitals  Enc Vitals Group     BP 11/20/22 2133 132/74     Pulse Rate 11/20/22 2133 97     Resp 11/20/22 2133 18     Temp 11/20/22 2133 98 F (36.7 C)     Temp Source 11/20/22 2133 Oral     SpO2 11/20/22 2133 99 %     Weight 11/20/22 2124 270 lb (122.5 kg)     Height 11/20/22 2124 5\' 5"  (1.651 m)     Head Circumference --      Peak Flow --      Pain Score 11/20/22 2124 6     Pain Loc --      Pain Edu? --      Excl. in [place]? --     Most recent vital signs: Vitals:   11/20/22 2133  BP: 132/74  Pulse: 97  Resp: 18  Temp: 98 F (36.7 C)  SpO2: 99%    Constitutional: Alert and oriented. Eyes: Conjunctivae are normal. Head: Atraumatic. Nose: No congestion/rhinnorhea. Mouth/Throat: Mucous membranes are moist.  Cardiovascular: Normal rate, regular rhythm. Grossly normal heart sounds.  2+ radial pulses  bilaterally. Respiratory: Normal respiratory effort.  No retractions. Lungs CTAB. Gastrointestinal: Soft and mildly tender to palpation in the suprapubic area with no rebound or guarding. No distention. Musculoskeletal: No lower extremity tenderness nor edema.  Neurologic:  Normal speech and language. No gross focal neurologic deficits are appreciated.    ED Results / Procedures / Treatments   Labs (all labs ordered are listed, but only abnormal results are displayed) Labs Reviewed  CBC WITH DIFFERENTIAL/PLATELET - Abnormal; Notable for the following components:      Result Value   Platelets 438 (*)    All other components within normal limits  COMPREHENSIVE METABOLIC PANEL - Abnormal; Notable for the following components:   Glucose, Bld 103 (*)    AST 11 (*)    All other components within normal limits  URINALYSIS, ROUTINE W REFLEX MICROSCOPIC - Abnormal; Notable for the following components:   Color, Urine YELLOW (*)    APPearance CLOUDY (*)    Hgb urine dipstick SMALL (*)    Protein, ur 30 (*)    Leukocytes,Ua LARGE (*)    Bacteria, UA RARE (*)    All other components within normal limits  URINE CULTURE  LIPASE, BLOOD  POC URINE PREG, ED    PROCEDURES:  Critical Care performed: No  Procedures   MEDICATIONS ORDERED IN ED: Medications  cephALEXin (KEFLEX) capsule 500 mg (has no administration in time range)     IMPRESSION / MDM / ASSESSMENT AND PLAN / ED COURSE  I reviewed the triage vital signs and the nursing notes.                              22 y.o. female with past medical history of hyperlipidemia, asthma, and GERD who presents to the ED complaining of abdominal pain, nausea, vomiting, and small amounts of blood in her emesis since yesterday afternoon.  Patient's presentation is most consistent with acute presentation with potential threat to life or bodily function.  Differential diagnosis includes, but is not limited to, hematemesis, upper GI bleed,  dehydration, electrolyte abnormality, AKI, ectopic pregnancy, UTI, kidney stone.  Patient well-appearing and in no acute distress, vital signs are unremarkable.  Her most recent episodes of vomiting did not contain any blood and there are no signs of significant GI bleeding at this time, hemoglobin is improved compared to previous.  She reports that her pain and nausea have improved, has been tolerating oral intake without difficulty since arrival to the ED.  She does have some suprapubic tenderness and endorses urinary frequency, urinalysis appears concerning for UTI.  Pregnancy testing is negative and remainder of labs are reassuring with no leukocytosis, electrolyte abnormality, or AKI.  LFTs are also unremarkable.  We will treat UTI with Keflex, also discharged home on Zofran.  With potential Mallory-Weiss tear, we will also start patient on PPI.  She was counseled to establish care with PCP and to return to the ED for new or worsening symptoms, patient agrees with plan.      FINAL CLINICAL IMPRESSION(S) / ED DIAGNOSES   Final diagnoses:  Lower abdominal pain  Nausea and vomiting, unspecified vomiting type  Mallory-Weiss syndrome  Acute cystitis without hematuria     Rx / DC Orders   ED Discharge Orders          Ordered    cephALEXin (KEFLEX) 500 MG capsule  4 times daily        11/21/22 0142    ondansetron (ZOFRAN-ODT) 4 MG disintegrating tablet  Every 8 hours PRN        11/21/22 0142    pantoprazole (PROTONIX) 40 MG tablet  Daily        11/21/22 0142             Note:  This document was prepared using Dragon voice recognition software and Seats include unintentional dictation errors.   Chesley Noon, MD 11/21/22 [phone]

## 2022-11-23 LAB — URINE CULTURE: Culture: >=100000 — AB

## 2022-11-27 ENCOUNTER — Encounter: Payer: Self-pay | Admitting: Emergency Medicine

## 2022-11-27 ENCOUNTER — Emergency Department: Payer: Medicaid Other

## 2022-11-27 ENCOUNTER — Other Ambulatory Visit: Payer: Self-pay

## 2022-11-27 ENCOUNTER — Emergency Department
Admission: EM | Admit: 2022-11-27 | Discharge: 2022-11-27 | Disposition: A | Discharge status: Home / Self Care | Payer: Medicaid Other | Attending: Emergency Medicine | Admitting: Emergency Medicine

## 2022-11-27 DIAGNOSIS — M25571 Pain in right ankle and joints of right foot: Secondary | ICD-10-CM | POA: Diagnosis not present

## 2022-11-27 MED ORDER — MELOXICAM 15 MG PO TABS: 15.0000 mg | ORAL_TABLET | Freq: Every day | ORAL | 0 refills | Status: AC

## 2022-11-27 NOTE — ED Provider Notes (Signed)
[facility] Provider Note  Patient Contact: 6:23 PM (approximate)   History   Ankle Pain   HPI  Tanya Mckee is a 22 y.o. female presents to the emergency department with acute right ankle pain and swelling that developed without trauma while patient was at work.  Patient has had a right prior ankle surgery and she became concerned.  No new inversion type injuries.      Physical Exam   Triage Vital Signs: ED Triage Vitals  Enc Vitals Group     BP 11/27/22 1716 130/84     Pulse Rate 11/27/22 1716 89     Resp 11/27/22 1716 18     Temp 11/27/22 1716 98.6 F (37 C)     Temp Source 11/27/22 1716 Oral     SpO2 11/27/22 1716 99 %     Weight --      Height --      Head Circumference --      Peak Flow --      Pain Score 11/27/22 1715 4     Pain Loc --      Pain Edu? --      Excl. in [place]? --     Most recent vital signs: Vitals:   11/27/22 1716  BP: 130/84  Pulse: 89  Resp: 18  Temp: 98.6 F (37 C)  SpO2: 99%     General: Alert and in no acute distress. Eyes:  PERRL. EOMI. Head: No acute traumatic findings ENT:      Nose: No congestion/rhinnorhea.      Mouth/Throat: Mucous membranes are moist. Neck: No stridor. No cervical spine tenderness to palpation. Cardiovascular:  Good peripheral perfusion Respiratory: Normal respiratory effort without tachypnea or retractions. Lungs CTAB. Good air entry to the bases with no decreased or absent breath sounds. Gastrointestinal: Bowel sounds 4 quadrants. Soft and nontender to palpation. No guarding or rigidity. No palpable masses. No distention. No CVA tenderness. Musculoskeletal: Full range of motion to all extremities.  No significant swelling of the right ankle.  Palpable dorsalis pedis pulse bilaterally and symmetrically.  Capillary refill less than 2 seconds on the right. Neurologic:  No gross focal neurologic deficits are appreciated.  Skin:   No rash noted Other:   ED Results / Procedures /  Treatments   Labs (all labs ordered are listed, but only abnormal results are displayed) Labs Reviewed - No data to display   RADIOLOGY  I personally viewed and evaluated these images as part of my medical decision making, as well as reviewing the written report by the radiologist.  ED Provider Interpretation: No acute abnormality on x-ray of the right ankle.  PROCEDURES:  Critical Care performed: No  Procedures   MEDICATIONS ORDERED IN ED: Medications - No data to display   IMPRESSION / MDM / ASSESSMENT AND PLAN / ED COURSE  I reviewed the triage vital signs and the nursing notes.                              Assessment and plan Right ankle pain 22 year old female presents to the emergency department with acute right ankle pain.  No acute bony abnormalities were visualized on x-ray.  Patient was discharged with meloxicam.  I reviewed patient's medical record and see that she has taken this medication in the past without allergy.  She was advised to follow-up with her surgeon who performed her prior surgery.  All  patient questions were answered.   FINAL CLINICAL IMPRESSION(S) / ED DIAGNOSES   Final diagnoses:  Acute right ankle pain     Rx / DC Orders   ED Discharge Orders          Ordered    meloxicam (MOBIC) 15 MG tablet  Daily        11/27/22 1816             Note:  This document was prepared using Dragon voice recognition software and Heater include unintentional dictation errors.   Pia Mau Menno, PA-C 11/27/22 1825    Merwyn Katos, MD 11/28/22 [phone]

## 2022-11-27 NOTE — Discharge Instructions (Addendum)
Take Meloxicam once daily for pain and inflammation.  

## 2022-11-27 NOTE — ED Triage Notes (Signed)
Patient to ED from work for right ankle pain starting today while at work. No new injury but hx of same in 2017. Ambulatory to triage.

## 2022-12-31 ENCOUNTER — Other Ambulatory Visit: Payer: Self-pay

## 2022-12-31 DIAGNOSIS — Z5321 Procedure and treatment not carried out due to patient leaving prior to being seen by health care provider: Secondary | ICD-10-CM | POA: Diagnosis not present

## 2022-12-31 DIAGNOSIS — Y92410 Unspecified street and highway as the place of occurrence of the external cause: Secondary | ICD-10-CM | POA: Diagnosis not present

## 2022-12-31 DIAGNOSIS — M545 Low back pain, unspecified: Secondary | ICD-10-CM | POA: Insufficient documentation

## 2022-12-31 DIAGNOSIS — M549 Dorsalgia, unspecified: Secondary | ICD-10-CM | POA: Diagnosis present
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
## 2023-01-01 ENCOUNTER — Emergency Department
Admission: EM | Admit: 2023-01-01 | Discharge: 2023-01-01 | Disposition: A | Discharge status: Home / Self Care | Payer: Medicaid Other | Source: Home / Self Care | Attending: Emergency Medicine | Admitting: Emergency Medicine

## 2023-01-01 ENCOUNTER — Emergency Department: Payer: Medicaid Other

## 2023-01-01 ENCOUNTER — Emergency Department
Admission: EM | Admit: 2023-01-01 | Discharge: 2023-01-01 | Discharge status: Against Medical Advice | Payer: Medicaid Other | Attending: Emergency Medicine | Admitting: Emergency Medicine

## 2023-01-01 DIAGNOSIS — Y92410 Unspecified street and highway as the place of occurrence of the external cause: Secondary | ICD-10-CM | POA: Insufficient documentation

## 2023-01-01 DIAGNOSIS — M545 Low back pain, unspecified: Secondary | ICD-10-CM

## 2023-01-01 LAB — POC URINE PREG, ED: Preg Test, Ur: NEGATIVE

## 2023-01-01 MED ORDER — LIDOCAINE 5 % EX PTCH: 1.0000 | MEDICATED_PATCH | Freq: Two times a day (BID) | CUTANEOUS | 0 refills | Status: DC

## 2023-01-01 MED ORDER — LIDOCAINE 5 % EX PTCH
1.0000 | MEDICATED_PATCH | CUTANEOUS | Status: DC
  Administered 2023-01-01: 1 via TRANSDERMAL
  Filled 2023-01-01: qty 1
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
## 2023-01-01 NOTE — ED Notes (Signed)
Pt states she wants to leave to go to the store, advised pt that if she leaves she will have to start over. Pt verbalized understanding.

## 2023-04-04 ENCOUNTER — Emergency Department (HOSPITAL_COMMUNITY)
Admission: EM | Admit: 2023-04-04 | Discharge: 2023-04-04 | Discharge status: Against Medical Advice | Payer: Medicaid Other | Attending: Emergency Medicine | Admitting: Emergency Medicine

## 2023-04-04 ENCOUNTER — Other Ambulatory Visit: Payer: Self-pay

## 2023-04-04 DIAGNOSIS — Z5321 Procedure and treatment not carried out due to patient leaving prior to being seen by health care provider: Secondary | ICD-10-CM | POA: Diagnosis not present

## 2023-04-04 DIAGNOSIS — R109 Unspecified abdominal pain: Secondary | ICD-10-CM | POA: Insufficient documentation

## 2023-04-04 LAB — COMPREHENSIVE METABOLIC PANEL
ALT: 12 U/L (ref 0–44)
AST: 13 U/L — ABNORMAL LOW (ref 15–41)
Albumin: 4 g/dL (ref 3.5–5.0)
Alkaline Phosphatase: 61 U/L (ref 38–126)
Anion gap: 11 (ref 5–15)
BUN: 15 mg/dL (ref 6–20)
CO2: 25 mmol/L (ref 22–32)
Calcium: 9.4 mg/dL (ref 8.9–10.3)
Chloride: 103 mmol/L (ref 98–111)
Creatinine, Ser: 0.74 mg/dL (ref 0.44–1.00)
GFR, Estimated: >60 mL/min (ref >60)
Glucose, Bld: 118 mg/dL — ABNORMAL HIGH (ref 70–99)
Potassium: 3.8 mmol/L (ref 3.5–5.1)
Sodium: 139 mmol/L (ref 135–145)
Total Bilirubin: 0.5 mg/dL (ref 0.3–1.2)
Total Protein: 7.2 g/dL (ref 6.5–8.1)

## 2023-04-04 LAB — URINALYSIS, ROUTINE W REFLEX MICROSCOPIC
Glucose, UA: NEGATIVE mg/dL
Ketones, ur: 5 mg/dL — AB
Nitrite: NEGATIVE
Protein, ur: 30 mg/dL — AB
Specific Gravity, Urine: 1.034 — ABNORMAL HIGH (ref 1.005–1.030)
pH: 5 (ref 5.0–8.0)

## 2023-04-04 LAB — CBC WITH DIFFERENTIAL/PLATELET
Abs Immature Granulocytes: 0.01 10*3/uL (ref 0.00–0.07)
Basophils Absolute: 0 10*3/uL (ref 0.0–0.1)
Basophils Relative: 1 %
Eosinophils Absolute: 0.1 10*3/uL (ref 0.0–0.5)
Eosinophils Relative: 3 %
HCT: 40.4 % (ref 36.0–46.0)
Hemoglobin: 13.2 g/dL (ref 12.0–15.0)
Immature Granulocytes: 0 %
Lymphocytes Relative: 21 %
Lymphs Abs: 1 10*3/uL (ref 0.7–4.0)
MCH: 28.5 pg (ref 26.0–34.0)
MCHC: 32.7 g/dL (ref 30.0–36.0)
MCV: 87.3 fL (ref 80.0–100.0)
Monocytes Absolute: 0.4 10*3/uL (ref 0.1–1.0)
Monocytes Relative: 8 %
Neutro Abs: 3.3 10*3/uL (ref 1.7–7.7)
Neutrophils Relative %: 67 %
Platelets: 346 10*3/uL (ref 150–400)
RBC: 4.63 MIL/uL (ref 3.87–5.11)
RDW: 12.1 % (ref 11.5–15.5)
WBC: 4.8 10*3/uL (ref 4.0–10.5)
nRBC: 0 % (ref 0.0–0.2)

## 2023-04-04 LAB — LIPASE, BLOOD: Lipase: 29 U/L (ref 11–51)

## 2023-04-04 LAB — HCG, SERUM, QUALITATIVE: Preg, Serum: NEGATIVE

## 2023-04-04 NOTE — ED Triage Notes (Signed)
Patient reports pain across her abdomen today , no emesis or diarrhea , denies fever or chills.

## 2023-04-04 NOTE — ED Provider Triage Note (Signed)
Emergency Medicine Provider Triage Evaluation Note  Tanya Mckee , a 22 y.o. female  was evaluated in triage.  Pt complains of concerns for diffuse abdominal pain.  No meds tried prior to arrival.  Denies nausea, vomiting, diarrhea, constipation, urinary symptoms.  Her last menstrual cycle was 2 weeks ago.  Review of Systems  Positive:  Negative:   Physical Exam  BP 121/73 (BP Location: Right Arm)   Pulse 82   Temp 98.9 F (37.2 C)   Resp 18   LMP 03/21/2023   SpO2 100%  Gen:   Awake, no distress   Resp:  Normal effort  MSK:   Moves extremities without difficulty  Other:  Diffuse abdominal tenderness to palpation  Medical Decision Making  Medically screening exam initiated at 8:20 PM.  Appropriate orders placed.  Tanya Mckee was informed that the remainder of the evaluation will be completed by another provider, this initial triage assessment does not replace that evaluation, and the importance of remaining in the ED until their evaluation is complete.  Workup initiated   Deon Ivey A, PA-C 04/04/23 2027

## 2023-04-12 ENCOUNTER — Inpatient Hospital Stay (HOSPITAL_COMMUNITY)
Admission: AD | Admit: 2023-04-12 | Discharge: 2023-04-12 | Disposition: A | Discharge status: Home / Self Care | Payer: Medicaid Other | Attending: Family Medicine | Admitting: Family Medicine

## 2023-04-12 DIAGNOSIS — N939 Abnormal uterine and vaginal bleeding, unspecified: Secondary | ICD-10-CM | POA: Diagnosis not present

## 2023-04-12 DIAGNOSIS — Z3202 Encounter for pregnancy test, result negative: Secondary | ICD-10-CM

## 2023-04-12 DIAGNOSIS — Z09 Encounter for follow-up examination after completed treatment for conditions other than malignant neoplasm: Secondary | ICD-10-CM | POA: Diagnosis present

## 2023-04-12 NOTE — MAU Provider Note (Addendum)
Event Date/Time   First Provider Initiated Contact with Patient 04/12/23 2220      S Ms. Tanya Mckee is a 22 y.o. G2P1001 patient who presents to MAU today with complaint of having had a miscarriage last week and having a small amount of bleeding which has stopped.  States she thinks she needs a D&C "because you have to have one after a miscarriage"  Denies any complaints necessitating this. .Denies current bleeding or pain.    RN Note: [name] is a 22 y.o. at Unknown here in MAU reporting: vomiting since 5 pm and emesis had blood in it. Also states she had some blood in her urine earlier today but that has stopped. Reports she had a miscarriage last week in [place]. States she was 4 months pregnant when she miscarried. Pt states she had gone to [place] to stay with someone because of the pregnancy. States she is staying at a homeless shelter here in [place].  Talking to someone on phone while talking to me  O BP (!) 139/95 (BP Location: Right Arm)   Pulse 80   Temp 98.4 F (36.9 C) (Oral)   Resp 16   Ht 5\' 5"  (1.651 m)   Wt 108.9 kg   LMP 03/21/2023   SpO2 100%   BMI 39.94 kg/m  Physical Exam Vitals reviewed.  Constitutional:      General: She is not in acute distress.    Appearance: She is not ill-appearing or toxic-appearing.  HENT:     Head: Normocephalic.  Cardiovascular:     Rate and Rhythm: Normal rate.  Pulmonary:     Effort: Pulmonary effort is normal.  Skin:    General: Skin is warm and dry.  Neurological:     General: No focal deficit present.     Mental Status: She is alert.  Psychiatric:        Mood and Affect: Mood normal.     A Medical screening exam complete Spotting Negative pregnancy test  P Discharge from MAU in stable condition Patient given the option of transfer to [facility] for further evaluation or seek care in outpatient facility of choice  List of options for follow-up given  Warning signs for worsening condition that would warrant  emergency follow-up discussed   Aviva Signs, CNM 04/12/2023 10:20 PM

## 2023-04-12 NOTE — Progress Notes (Signed)
Tanya Mckee CNM in Triage to see pt and discuss d/c plan. Written and verbal d/c instructions given by CNM. Pt requested bus pass and RN called house coverage, [facility]. He looked but did not have any passes. Pt made aware as she was talking on the phone to someone. Pt then asked if she could wait in the lobby and pt shown the lobby. Pt states her brother is coming to walk home with her. Pt stated earlier she lives at a shelter.

## 2023-04-13 LAB — POCT PREGNANCY, URINE: Preg Test, Ur: NEGATIVE

## 2023-05-21 ENCOUNTER — Encounter (HOSPITAL_COMMUNITY): Payer: Self-pay

## 2023-05-21 ENCOUNTER — Ambulatory Visit (HOSPITAL_COMMUNITY)
Admission: EM | Admit: 2023-05-21 | Discharge: 2023-05-21 | Disposition: A | Discharge status: Home / Self Care | Payer: Medicaid Other | Attending: Internal Medicine | Admitting: Internal Medicine

## 2023-05-21 DIAGNOSIS — L0100 Impetigo, unspecified: Secondary | ICD-10-CM | POA: Diagnosis not present

## 2023-05-21 DIAGNOSIS — L03317 Cellulitis of buttock: Secondary | ICD-10-CM

## 2023-05-21 MED ORDER — CEPHALEXIN 500 MG PO CAPS: 500.0000 mg | ORAL_CAPSULE | Freq: Two times a day (BID) | ORAL | 0 refills | Status: AC

## 2023-05-21 MED ORDER — MUPIROCIN 2 % EX OINT: 1.0000 | TOPICAL_OINTMENT | Freq: Two times a day (BID) | CUTANEOUS | 0 refills | Status: DC

## 2023-05-21 MED ORDER — SULFAMETHOXAZOLE-TRIMETHOPRIM 800-160 MG PO TABS: 1.0000 | ORAL_TABLET | Freq: Two times a day (BID) | ORAL | 0 refills | Status: AC

## 2023-05-21 NOTE — Discharge Instructions (Addendum)
Take Bactrim antibiotic twice daily for the next 7 days to treat cellulitis to the buttock. Take cephalexin antibiotic twice daily for the next 7 days to treat infection to the buttock and the face. Apply mupirocin ointment to the face lesions twice daily for the next 7 days as well.  Apply warm compresses to all areas of infection.  If you start noticing any new or worsening signs of infection such as redness, swelling, pus, pain, or fever/chills, please return to urgent care to have this reevaluated.  If you develop any new or worsening symptoms or if your symptoms do not start to improve, please return here or follow-up with your primary care provider. If your symptoms are severe, please go to the emergency room.

## 2023-05-21 NOTE — ED Provider Notes (Signed)
[facility]    CSN: [id number] Arrival date & time: 05/21/23  1346      History   Chief Complaint Chief Complaint  Patient presents with   Insect Bite    HPI Tanya Mckee is a 22 y.o. female.   Patient presents to urgent care for evaluation of lesion to the right buttocks that started approximately 2 weeks ago.  Patient was homeless 2 weeks ago and believes that she Pavlich have been bitten by spiders but is unsure.  She recently moved in with her boyfriend and is no longer homeless but has noticed significant warmth, swelling, drainage, and tenderness to the right hip/buttock over the last week.  She also notes some lesions to the face that are draining thick purulent/yellow fluid that started a couple of weeks ago as well.  No fevers, chills, body aches, nausea, vomiting, abdominal pain, recent antibiotic/steroid use, or recent trauma/injuries to the buttocks/face.  No history of immunosuppression.     Past Medical History:  Diagnosis Date   ADD (attention deficit disorder)    ADHD (attention deficit hyperactivity disorder)    Anxiety    Asthma    Bipolar affective disorder (HCC)    Depression    GERD (gastroesophageal reflux disease)    Hyperlipidemia    OCD (obsessive compulsive disorder)    Oppositional defiant disorder     Patient Active Problem List   Diagnosis Date Noted   Sore throat 12/27/2021   Postpartum care following vaginal delivery 07/19/2017   Gestational hypertension 07/16/2017   Abdominal pain affecting pregnancy 05/29/2017   Monilial vulvovaginitis 05/29/2017   UTI (urinary tract infection) during pregnancy, third trimester 05/29/2017   Dyspnea on exertion 10/20/2015   Fatigue 10/20/2015   Elevated blood pressure 06/16/2015   Attention deficit disorder with hyperactivity 05/21/2015   Allergic rhinitis 05/21/2015   Asthma 05/21/2015   GERD (gastroesophageal reflux disease) 05/21/2015   HLD (hyperlipidemia) 05/21/2015   Extreme  obesity 05/21/2015   Apnea, sleep 05/21/2015    Past Surgical History:  Procedure Laterality Date   ORIF ANKLE FRACTURE Right 02/11/2016   Procedure: OPEN REDUCTION INTERNAL FIXATION (ORIF) ANKLE FRACTURE;  Surgeon: Gwyneth Revels, DPM;  Location: [facility] ORS;  Service: Podiatry;  Laterality: Right;   TONSILLECTOMY AND ADENOIDECTOMY  2007   UPPER GI ENDOSCOPY  10/08/2009   Possible Esophagitis    OB History     Gravida  2   Para  1   Term  1   Preterm  0   AB  0   Living  1      SAB  0   IAB  0   Ectopic  0   Multiple  0   Live Births  1            Home Medications    Prior to Admission medications   Medication Sig Start Date End Date Taking? Authorizing Provider  cephALEXin (KEFLEX) 500 MG capsule Take 1 capsule (500 mg total) by mouth 2 (two) times daily for 7 days. 05/21/23 05/28/23 Yes Dominyk Law, Donavan Burnet, FNP  mupirocin ointment (BACTROBAN) 2 % Apply 1 Application topically 2 (two) times daily. 05/21/23  Yes Carlisle Beers, FNP  sulfamethoxazole-trimethoprim (BACTRIM DS) 800-160 MG tablet Take 1 tablet by mouth 2 (two) times daily for 7 days. 05/21/23 05/28/23 Yes Carlisle Beers, FNP  pantoprazole (PROTONIX) 40 MG tablet Take 1 tablet (40 mg total) by mouth daily. Patient not taking: Reported on 05/21/2023 11/21/22 12/21/22  Chesley Noon, MD  albuterol (VENTOLIN HFA) 108 (90 Base) MCG/ACT inhaler Inhale 2 puffs into the lungs every 6 (six) hours as needed for wheezing or shortness of breath. Patient not taking: Reported on 07/15/2020 08/20/19 09/24/20  Nita Sickle, MD  budesonide-formoterol [facility]) 160-4.5 MCG/ACT inhaler Inhale 2 puffs into the lungs 2 (two) times daily. Patient not taking: Reported on 07/15/2020  09/24/20  [provider]  ferrous sulfate 325 (65 FE) MG EC tablet Take 325 mg by mouth daily with breakfast. Patient not taking: Reported on 07/15/2020  09/24/20  [provider]  labetalol (NORMODYNE) 200 MG  tablet Take 1 tablet (200 mg total) by mouth 2 (two) times daily. Patient not taking: Reported on 07/15/2020 07/21/17 09/24/20  Natale Milch, MD    Family History Family History  Problem Relation Age of Onset   Seizures Mother    Stroke Mother    Phillips Odor White syndrome Father    Breast cancer Maternal Grandmother    Lupus Maternal Grandmother    Heart attack Maternal Grandfather    CAD Maternal Grandfather        has stent   Bone cancer Maternal Grandfather    Kidney failure Paternal Grandmother    Hypertension Paternal Grandfather    CVA Paternal Grandfather    Diabetes Other    Diabetes Other    Breast cancer Other    Epilepsy Maternal Uncle     Social History Social History   Tobacco Use   Smoking status: Never   Smokeless tobacco: Never  Vaping Use   Vaping status: Every Day   Substances: Nicotine, Flavoring  Substance Use Topics   Alcohol use: No    Alcohol/week: 0.0 standard drinks of alcohol   Drug use: No     Allergies   Aleve [naproxen], Aspirin, Cetirizine, Ibuprofen, Latex, Shellfish allergy, and Other   Review of Systems Review of Systems Per HPI  Physical Exam Triage Vital Signs ED Triage Vitals  Encounter Vitals Group     BP 05/21/23 1455 119/81     Systolic BP Percentile --      Diastolic BP Percentile --      Pulse Rate 05/21/23 1455 90     Resp 05/21/23 1455 16     Temp 05/21/23 1455 97.8 F (36.6 C)     Temp Source 05/21/23 1455 Oral     SpO2 05/21/23 1455 97 %     Weight 05/21/23 1455 237 lb (107.5 kg)     Height 05/21/23 1455 5\' 5"  (1.651 m)     Head Circumference --      Peak Flow --      Pain Score 05/21/23 1454 4     Pain Loc --      Pain Education --      Exclude from Growth Chart --    No data found.  Updated Vital Signs BP 119/81 (BP Location: Right Arm)   Pulse 90   Temp 97.8 F (36.6 C) (Oral)   Resp 16   Ht 5\' 5"  (1.651 m)   Wt 237 lb (107.5 kg)   LMP 04/21/2023 (Approximate)   SpO2 97%   BMI  39.44 kg/m   Visual Acuity Right Eye Distance:   Left Eye Distance:   Bilateral Distance:    Right Eye Near:   Left Eye Near:    Bilateral Near:     Physical Exam Vitals and nursing note reviewed.  Constitutional:      Appearance: She is  not ill-appearing or toxic-appearing.  HENT:     Head: Normocephalic and atraumatic.     Right Ear: Hearing and external ear normal.     Left Ear: Hearing and external ear normal.     Nose: Nose normal.     Mouth/Throat:     Lips: Pink.  Eyes:     General: Lids are normal. Vision grossly intact. Gaze aligned appropriately.     Extraocular Movements: Extraocular movements intact.     Conjunctiva/sclera: Conjunctivae normal.  Pulmonary:     Effort: Pulmonary effort is normal.  Musculoskeletal:     Cervical back: Neck supple.  Skin:    General: Skin is warm and dry.     Capillary Refill: Capillary refill takes less than 2 seconds.     Findings: Rash (Impetigo to face) and wound (Right buttock) present. Rash is crusting and pustular.       Neurological:     General: No focal deficit present.     Mental Status: She is alert and oriented to person, place, and time. Mental status is at baseline.     Cranial Nerves: No dysarthria or facial asymmetry.  Psychiatric:        Mood and Affect: Mood normal.        Speech: Speech normal.        Behavior: Behavior normal.        Thought Content: Thought content normal.        Judgment: Judgment normal.    Right buttock   Left face   Right face    UC Treatments / Results  Labs (all labs ordered are listed, but only abnormal results are displayed) Labs Reviewed - No data to display  EKG   Radiology No results found.  Procedures Procedures (including critical care time)  Medications Ordered in UC Medications - No data to display  Initial Impression / Assessment and Plan / UC Course  I have reviewed the triage vital signs and the nursing notes.  Pertinent labs & imaging  results that were available during my care of the patient were reviewed by me and considered in my medical decision making (see chart for details).   1.  Cellulitis of right buttock, impetigo Presentation consistent with cellulitis to the right buttock.  Will manage this with Bactrim antibiotic and Keflex twice daily for 7 days.  Keflex would also treat impetigo.  Mupirocin topically to the face twice daily for 7 days.   No palpable underlying soft tissue abscess to the right buttock.  Warm compresses encouraged.  She does not have risk factors for immunosuppression/delayed wound healing.  She does not have a PCP, PCP assistance initiated today. Infection return precautions discussed.  Counseled patient on potential for adverse effects with medications prescribed/recommended today, strict ER and return-to-clinic precautions discussed, patient verbalized understanding.    Final Clinical Impressions(s) / UC Diagnoses   Final diagnoses:  Cellulitis of right buttock  Impetigo     Discharge Instructions      Take Bactrim antibiotic twice daily for the next 7 days to treat cellulitis to the buttock. Take cephalexin antibiotic twice daily for the next 7 days to treat infection to the buttock and the face. Apply mupirocin ointment to the face lesions twice daily for the next 7 days as well.  Apply warm compresses to all areas of infection.  If you start noticing any new or worsening signs of infection such as redness, swelling, pus, pain, or fever/chills, please return to urgent care  to have this reevaluated.  If you develop any new or worsening symptoms or if your symptoms do not start to improve, please return here or follow-up with your primary care provider. If your symptoms are severe, please go to the emergency room.     ED Prescriptions     Medication Sig Dispense Auth. Provider   sulfamethoxazole-trimethoprim (BACTRIM DS) 800-160 MG tablet Take 1 tablet by mouth 2 (two) times  daily for 7 days. 14 tablet Reita Diehl M, FNP   cephALEXin (KEFLEX) 500 MG capsule Take 1 capsule (500 mg total) by mouth 2 (two) times daily for 7 days. 14 capsule Carlisle Beers, FNP   mupirocin ointment (BACTROBAN) 2 % Apply 1 Application topically 2 (two) times daily. 22 g Carlisle Beers, FNP      PDMP not reviewed this encounter.   Carlisle Beers, [place] 05/21/23 1549

## 2023-05-21 NOTE — ED Triage Notes (Signed)
Patient here today with c/o multiple red spots on right buttock that she noticed upon waking 2 weeks ago. Patient states that there are 4 painful red spots.

## 2023-06-07 NOTE — Plan of Care (Signed)
[facility] Tonsillectomy/Adenoidectomy, Postoperative PEDS care plan entered in error.

## 2023-06-28 ENCOUNTER — Encounter (HOSPITAL_COMMUNITY): Payer: Self-pay

## 2023-06-28 ENCOUNTER — Other Ambulatory Visit: Payer: Self-pay

## 2023-06-28 ENCOUNTER — Emergency Department (HOSPITAL_COMMUNITY)
Admission: EM | Admit: 2023-06-28 | Discharge: 2023-06-28 | Discharge status: Against Medical Advice | Payer: Medicaid Other | Attending: Emergency Medicine | Admitting: Emergency Medicine

## 2023-06-28 ENCOUNTER — Emergency Department (HOSPITAL_COMMUNITY): Payer: Medicaid Other

## 2023-06-28 DIAGNOSIS — Z91040 Latex allergy status: Secondary | ICD-10-CM | POA: Diagnosis not present

## 2023-06-28 DIAGNOSIS — Z20822 Contact with and (suspected) exposure to covid-19: Secondary | ICD-10-CM | POA: Diagnosis not present

## 2023-06-28 DIAGNOSIS — J069 Acute upper respiratory infection, unspecified: Secondary | ICD-10-CM | POA: Diagnosis not present

## 2023-06-28 DIAGNOSIS — R059 Cough, unspecified: Secondary | ICD-10-CM | POA: Diagnosis present

## 2023-06-28 LAB — COMPREHENSIVE METABOLIC PANEL
ALT: 12 U/L (ref 0–44)
AST: 12 U/L — ABNORMAL LOW (ref 15–41)
Albumin: 3.8 g/dL (ref 3.5–5.0)
Alkaline Phosphatase: 54 U/L (ref 38–126)
Anion gap: 7 (ref 5–15)
BUN: 13 mg/dL (ref 6–20)
CO2: 25 mmol/L (ref 22–32)
Calcium: 8.9 mg/dL (ref 8.9–10.3)
Chloride: 107 mmol/L (ref 98–111)
Creatinine, Ser: 0.83 mg/dL (ref 0.44–1.00)
GFR, Estimated: >60 mL/min (ref >60)
Glucose, Bld: 103 mg/dL — ABNORMAL HIGH (ref 70–99)
Potassium: 3.9 mmol/L (ref 3.5–5.1)
Sodium: 139 mmol/L (ref 135–145)
Total Bilirubin: 0.5 mg/dL (ref <1.2)
Total Protein: 6.7 g/dL (ref 6.5–8.1)

## 2023-06-28 LAB — MONONUCLEOSIS SCREEN: Mono Screen: NEGATIVE

## 2023-06-28 LAB — URINALYSIS, ROUTINE W REFLEX MICROSCOPIC
Bilirubin Urine: NEGATIVE
Glucose, UA: NEGATIVE mg/dL
Ketones, ur: 5 mg/dL — AB
Nitrite: NEGATIVE
Protein, ur: NEGATIVE mg/dL
Specific Gravity, Urine: 1.026 (ref 1.005–1.030)
pH: 6 (ref 5.0–8.0)

## 2023-06-28 LAB — RESP PANEL BY RT-PCR (RSV, FLU A&B, COVID)  RVPGX2
Influenza A by PCR: NEGATIVE
Influenza B by PCR: NEGATIVE
Resp Syncytial Virus by PCR: NEGATIVE
SARS Coronavirus 2 by RT PCR: NEGATIVE

## 2023-06-28 LAB — CBC
HCT: 39.6 % (ref 36.0–46.0)
Hemoglobin: 13 g/dL (ref 12.0–15.0)
MCH: 29.3 pg (ref 26.0–34.0)
MCHC: 32.8 g/dL (ref 30.0–36.0)
MCV: 89.4 fL (ref 80.0–100.0)
Platelets: 374 10*3/uL (ref 150–400)
RBC: 4.43 MIL/uL (ref 3.87–5.11)
RDW: 12.6 % (ref 11.5–15.5)
WBC: 5.7 10*3/uL (ref 4.0–10.5)
nRBC: 0 % (ref 0.0–0.2)

## 2023-06-28 LAB — HCG, SERUM, QUALITATIVE: Preg, Serum: NEGATIVE

## 2023-06-28 LAB — LIPASE, BLOOD: Lipase: 32 U/L (ref 11–51)

## 2023-06-28 MED ORDER — ACETAMINOPHEN 325 MG PO TABS
650.0000 mg | ORAL_TABLET | Freq: Once | ORAL | Status: AC
  Administered 2023-06-28: 650 mg via ORAL
  Filled 2023-06-28: qty 2

## 2023-06-28 MED ORDER — ONDANSETRON HCL 4 MG PO TABS: 4.0000 mg | ORAL_TABLET | Freq: Four times a day (QID) | ORAL | 0 refills | Status: AC

## 2023-06-28 NOTE — ED Provider Notes (Signed)
[place] [facility]PARTMENT AT [facility] Provider Note   CSN: [id number] Arrival date & time: 06/28/23  1748     History  Chief Complaint  Patient presents with   Sore Throat   Nasal Congestion   Emesis    Tanya Mckee is a 22 y.o. female past medical history of OCD, oppositional defiant disorder, ADD, ADHD, anxiety, bipolar, depression, GERD presented for sore throat, cough for the past 13 days.  Patient states that she does have a sick contact that she was smoking with that tested positive for mono and think she [place] have mono.  Patient states she has had nausea vomiting but denies any hematemesis or dysuria.  Patient denies productive cough.  Patient denies fevers, chest pain, shortness of breath.  Patient states that both her ears hurt as well.  Patient denies any neck stiffness or EMS or changes in sensation/motor skills.  Patient is not taking anything for pain.  Home Medications Prior to Admission medications   Medication Sig Start Date End Date Taking? Authorizing Provider  Pseudoephedrine-APAP-DM (DAYQUIL PO) Take 15 mLs by mouth daily as needed (for cold symtoms).   Yes [provider]  pantoprazole (PROTONIX) 40 MG tablet Take 1 tablet (40 mg total) by mouth daily. Patient not taking: Reported on 05/21/2023 11/21/22 12/21/22  Chesley Noon, MD  albuterol (VENTOLIN HFA) 108 (90 Base) MCG/ACT inhaler Inhale 2 puffs into the lungs every 6 (six) hours as needed for wheezing or shortness of breath. Patient not taking: Reported on 07/15/2020 08/20/19 09/24/20  Nita Sickle, MD  budesonide-formoterol [facility]) 160-4.5 MCG/ACT inhaler Inhale 2 puffs into the lungs 2 (two) times daily. Patient not taking: Reported on 07/15/2020  09/24/20  [provider]  ferrous sulfate 325 (65 FE) MG EC tablet Take 325 mg by mouth daily with breakfast. Patient not taking: Reported on 07/15/2020  09/24/20  [provider]  labetalol (NORMODYNE) 200 MG tablet  Take 1 tablet (200 mg total) by mouth 2 (two) times daily. Patient not taking: Reported on 07/15/2020 07/21/17 09/24/20  Natale Milch, MD      Allergies    Aleve [naproxen], Aspirin, Cetirizine, Ibuprofen, Latex, Shellfish allergy, and Other    Review of Systems   Review of Systems  Gastrointestinal:  Positive for vomiting.    Physical Exam Updated Vital Signs BP (!) 142/94 (BP Location: Right Arm)   Pulse 94   Temp 98.7 F (37.1 C)   Resp 18   Ht 5\' 5"  (1.651 m)   Wt 107.5 kg   SpO2 100%   BMI 39.44 kg/m  Physical Exam Vitals reviewed.  Constitutional:      General: She is not in acute distress. HENT:     Head: Normocephalic and atraumatic.     Jaw: There is normal jaw occlusion.     Salivary Glands: Right salivary gland is not diffusely enlarged or tender. Left salivary gland is not diffusely enlarged or tender.     Comments: No facial swelling    Right Ear: Hearing, tympanic membrane, ear canal and external ear normal.     Left Ear: Hearing, tympanic membrane, ear canal and external ear normal.     Nose: Congestion present.     Mouth/Throat:     Lips: Pink.     Mouth: Mucous membranes are moist.     Pharynx: Oropharynx is clear. Uvula midline. Posterior oropharyngeal erythema present. No oropharyngeal exudate.     Comments: No oral floor swelling Tolerating secretions No  purulent drainage no No PTA noted No muffled voice noted Eyes:     Extraocular Movements: Extraocular movements intact.     Conjunctiva/sclera: Conjunctivae normal.     Pupils: Pupils are equal, round, and reactive to light.  Neck:     Comments: No neck swelling Cardiovascular:     Rate and Rhythm: Normal rate and regular rhythm.     Pulses: Normal pulses.     Heart sounds: Normal heart sounds.     Comments: 2+ bilateral radial/dorsalis pedis pulses with regular rate Pulmonary:     Effort: Pulmonary effort is normal. No respiratory distress.     Breath sounds: Normal breath sounds.   Abdominal:     Palpations: Abdomen is soft.     Tenderness: There is abdominal tenderness (Generalized). There is no guarding or rebound.  Musculoskeletal:        General: Normal range of motion.     Cervical back: Normal range of motion and neck supple. No rigidity or tenderness.     Comments: 5 out of 5 bilateral grip/leg extension strength  Lymphadenopathy:     Cervical: Cervical adenopathy present.  Skin:    General: Skin is warm and dry.     Capillary Refill: Capillary refill takes less than 2 seconds.  Neurological:     General: No focal deficit present.     Mental Status: She is alert and oriented to person, place, and time.     Comments: Sensation intact in all 4 limbs  Psychiatric:        Mood and Affect: Mood normal.     ED Results / Procedures / Treatments   Labs (all labs ordered are listed, but only abnormal results are displayed) Labs Reviewed  COMPREHENSIVE METABOLIC PANEL - Abnormal; Notable for the following components:      Result Value   Glucose, Bld 103 (*)    AST 12 (*)    All other components within normal limits  RESP PANEL BY RT-PCR (RSV, FLU A&B, COVID)  RVPGX2  LIPASE, BLOOD  CBC  HCG, SERUM, QUALITATIVE  URINALYSIS, ROUTINE W REFLEX MICROSCOPIC  MONONUCLEOSIS SCREEN    EKG None  Radiology No results found.  Procedures Procedures    Medications Ordered in ED Medications - No data to display  ED Course/ Medical Decision Making/ A&P                                 Medical Decision Making Amount and/or Complexity of Data Reviewed Labs: ordered.   Tanya Mckee 22 y.o. presented today for URI like symptoms. Working DDx that I considered at this time includes, but not limited to, viral illness, pharyngitis, mono, sinusitis, electrolyte abnormality, AOM.  R/o DDx: pharyngitis, mono, sinusitis, electrolyte abnormality, AOM: these diagnoses are not consistent with patient's history, presentation, physical exam, labs/imaging  findings.  Review of prior external notes: 05/21/2023 ED  Unique Tests and My Interpretation:  CBC: Unremarkable CMP: Unremarkable UA: White cells present Lipase: Unremarkable hCG serum qualitative: Negative Monoscreen: Negative Respiratory panel: Negative Chest x-ray: No acute cardiopulmonary changes  Social Determinants of Health: EtOH/Substance Abuse  Discussion with Independent Historian: None  Discussion of Management of Tests: None  Risk: Medium: prescription drug management  Risk Stratification Score: Centor 1  Plan: On exam patient was in no acute distress with stable vitals.  Patient's physical exam does show some anterior cervical lymphadenopathy and some generalized abdominal tenderness but otherwise  was reassuring.  I spoke to the patient while getting possibly an ultrasound to get her gallbladder she still has her gallbladder however patient declined at this time.  Patient Centor score is currently 1 and so will not swab for strep.  Patient requested to be tested for mono as she does have a close contact that test positive for mono and so we will add this on as well.  Patient states that she was originally ill at the beginning of the month but then got better and then got worse again in which she developed this cough and so we will get chest x-ray to rule out pneumonia.  Labs in triage are reassuring.  Patient does have some white cells in her urine but does not endorse any urinary symptoms and so will not treat.  Respiratory panel was also added on as well and if chest x-ray is negative will discharge with primary care follow-up as patient states she needs a primary care provider to follow-up with.  Will also p.o. challenge here and give Tylenol in the meantime.  Patient signed out to Eloise Harman, MD.  Please review their note for the continuation of patient's care.  The plan at this point is follow-up on chest x-ray and anticipate discharge for viral illness   This chart was  dictated using voice recognition software.  Despite best efforts to proofread,  errors can occur which can change the documentation meaning.         Final Clinical Impression(s) / ED Diagnoses Final diagnoses:  None    Rx / DC Orders ED Discharge Orders     None         Remi Deter 06/28/23 2203    Rondel Baton, MD 06/30/23 1118

## 2023-06-28 NOTE — ED Notes (Signed)
Patient left department prior to discharge.  Patient did not inform staff member that she was leaving.  Unable to locate patient in room or department.  MD aware

## 2023-06-28 NOTE — Discharge Instructions (Signed)
Please follow-up with your primary care provider regards resumes ER visit.  Today your labs imaging were all reassuring you most likely have a viral illness.  Please take Tylenol every 6 hours needed for pain and use the Zofran I prescribed for any nausea might have at home.  If symptoms change or worsen please return to ER.

## 2023-06-28 NOTE — ED Provider Notes (Signed)
  Physical Exam  BP 119/65 (BP Location: Right Arm)   Pulse 87   Temp 98.7 F (37.1 C)   Resp 18   Ht 5\' 5"  (1.651 m)   Wt 107.5 kg   SpO2 100%   BMI 39.44 kg/m   Physical Exam  Procedures  Procedures  ED Course / MDM   Clinical Course as of 06/28/23 2336  Thu Jun 28, 2023  2209 Assumed care from [place]. 22 yo F with n/v, cough, and sore throat. COVID/Flu and mono negative. Awaiting CXR and can be dc'd.  [RP]  2326 Chest x-ray has returned without acute abnormality.  Patient not in room.  Her belongings are gone.  Appears to have eloped.  Did try calling her phone number but she did not pick up. [RP]    Clinical Course User Index [RP] Rondel Baton, MD   Medical Decision Making Amount and/or Complexity of Data Reviewed Labs: ordered. Radiology: ordered.  Risk OTC drugs. Prescription drug management.      Rondel Baton, MD 06/28/23 [phone]

## 2023-06-28 NOTE — ED Notes (Signed)
Pt unable to void at this time. 

## 2023-06-28 NOTE — ED Notes (Signed)
Lab called and will add on Mononucleosis screen to available tubes

## 2023-06-28 NOTE — ED Notes (Signed)
Patient given water for PO challenge.  

## 2023-06-28 NOTE — ED Triage Notes (Addendum)
Pt came in via POV d/t feeling sick the last couple of days with body aches in her back & stomach. Endorses n/v, sore throat & runny nose, afebrile in triage.

## 2023-07-02 ENCOUNTER — Emergency Department
Admission: EM | Admit: 2023-07-02 | Discharge: 2023-07-02 | Disposition: A | Discharge status: Home / Self Care | Payer: Medicaid Other | Attending: Emergency Medicine | Admitting: Emergency Medicine

## 2023-07-02 ENCOUNTER — Other Ambulatory Visit: Payer: Self-pay

## 2023-07-02 DIAGNOSIS — S6991XA Unspecified injury of right wrist, hand and finger(s), initial encounter: Secondary | ICD-10-CM | POA: Diagnosis present

## 2023-07-02 DIAGNOSIS — W231XXA Caught, crushed, jammed, or pinched between stationary objects, initial encounter: Secondary | ICD-10-CM | POA: Insufficient documentation

## 2023-07-02 NOTE — ED Triage Notes (Signed)
Pt presents to ER with c/o nail on middle finger on right hand starting to come off.  Pt states she smashed her middle finger in a dryer a couple weeks ago.  Pt denies any other issues and is otherwise A&O x4 and in NAD.

## 2023-07-02 NOTE — Discharge Instructions (Signed)
As we discussed please leave your nail in place as long as it will stay.  Do not pull the nail off.  Return to the emergency department for any symptom concerning to yourself.

## 2023-07-02 NOTE — ED Provider Notes (Signed)
   [facility] Provider Note    Event Date/Time   First MD Initiated Contact with Patient 07/02/23 2215     (approximate)  History   Chief Complaint: Nail Problem  HPI  Tanya Mckee is a 22 y.o. female with a past medical history of ADHD, anxiety, bipolar, presents to the emergency department for evaluation of her right middle finger nail.  According to the patient approximately 1 to 2 weeks ago she slammed her right middle finger into the dryer door.  She has noticed that her nail has begun to peel off somewhat on that finger.  Patient was concerned she was came to the emergency department for evaluation.  Physical Exam   Triage Vital Signs: ED Triage Vitals  Encounter Vitals Group     BP 07/02/23 2117 (!) 141/74     Systolic BP Percentile --      Diastolic BP Percentile --      Pulse Rate 07/02/23 2117 94     Resp 07/02/23 2117 14     Temp 07/02/23 2117 98.1 F (36.7 C)     Temp Source 07/02/23 2117 Oral     SpO2 07/02/23 2117 96 %     Weight 07/02/23 2118 273 lb (123.8 kg)     Height 07/02/23 2118 5\' 5"  (1.651 m)     Head Circumference --      Peak Flow --      Pain Score 07/02/23 2118 4     Pain Loc --      Pain Education --      Exclude from Growth Chart --     Most recent vital signs: Vitals:   07/02/23 2117  BP: (!) 141/74  Pulse: 94  Resp: 14  Temp: 98.1 F (36.7 C)  SpO2: 96%    General: Awake, no distress.  CV:  Good peripheral perfusion.  Resp:  Normal effort. Other:  Patient's right middle finger overall appears well the nail is becoming detached somewhat from the nailbed but remains in the nailbed.  No sign of infection or other abnormality.   ED Results / Procedures / Treatments   MEDICATIONS ORDERED IN ED: Medications - No data to display   IMPRESSION / MDM / ASSESSMENT AND PLAN / ED COURSE  I reviewed the triage vital signs and the nursing notes.  Patient's presentation is most consistent with acute, uncomplicated  illness.  Patient presents to the emergency department for a right nail injury 1 to 2 weeks ago.  Now the nail seems to be coming off of the nailbed somewhat.  I discussed with the patient to leave it in place as long as possible to keep the nailbed open so a new nail will grow in its place.  No other abnormality on exam no infection.  Patient will follow-up with her doctor as needed.  FINAL CLINICAL IMPRESSION(S) / ED DIAGNOSES   Nail injury   Note:  This document was prepared using Dragon voice recognition software and [place] include unintentional dictation errors.   Minna Antis, MD 07/02/23 2223

## 2023-07-02 NOTE — ED Notes (Signed)
 Patient discharged from ED by provider. Discharge instructions reviewed with patient and all questions answered. Patient ambulatory from ED in NAD.
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
## 2023-12-05 NOTE — Congregational Nurse Program (Signed)
  Dept: [phone]   Congregational Nurse Program Note  Date of Encounter: 12/05/2023 Client to [facility] day center, nurse led clinic, for the first time in months. She reports she is "staying in [place]". She also stated that she is pregnant, about "5 months". Unclear about prenatal care, she stated she was "seeing a private doctor" her boyfriend had found in [place].  BP elevated at 140/72. Client stated it was because she "walks a lot".  RN encouraged her to follow up with her care provider. Client stated she had an appointment "next month".Juvenal Opoka BSN, RN  Past Medical History: Past Medical History:  Diagnosis Date   ADD (attention deficit disorder)    ADHD (attention deficit hyperactivity disorder)    Anxiety    Asthma    Bipolar affective disorder (HCC)    Depression    GERD (gastroesophageal reflux disease)    Hyperlipidemia    OCD (obsessive compulsive disorder)    Oppositional defiant disorder     Encounter Details:  Community Questionnaire - 12/05/23 0845       Questionnaire   Ask client: Do you give verbal consent for me to treat you today? Yes    Student Assistance N/A    Location Patient Served  [facility]    Encounter Setting CN site    Population Status Unhoused    Insurance Medicaid   Well care   Insurance/Financial Assistance Referral N/A    Medication N/A    Medical Provider No   client reports she goes to urgent care   Screening Referrals Made N/A    Medical Referrals Made N/A    Medical Appointment Completed N/A    CNP Interventions Advocate/Support    Screenings CN Performed N/A    ED Visit Averted N/A    Life-Saving Intervention Made N/A

## 2024-03-03 ENCOUNTER — Ambulatory Visit
Admission: EM | Admit: 2024-03-03 | Discharge: 2024-03-03 | Disposition: A | Discharge status: Home / Self Care | Attending: Family Medicine | Admitting: Family Medicine

## 2024-03-03 DIAGNOSIS — Z3200 Encounter for pregnancy test, result unknown: Secondary | ICD-10-CM | POA: Diagnosis not present

## 2024-03-03 LAB — PREGNANCY, URINE: Preg Test, Ur: NEGATIVE

## 2024-03-03 LAB — HCG, QUANTITATIVE, PREGNANCY: hCG, Beta Chain, Quant, S: 1 m[IU]/mL (ref <5)

## 2024-03-03 NOTE — ED Provider Notes (Signed)
 [facility]    CSN: [id number] Arrival date & time: 03/03/24  1522      History   Chief Complaint Chief Complaint  Patient presents with   Possible Pregnancy     HPI HPI Tanya Mckee is a 23 y.o. female.    Tanya Mckee presents for home pregnancy tests were positive.  Last positive test was in June. She has not had a period since April which lasted 3 days.  Has nausea, sleeping more, breast tenderness and heart burn. Has been vomiting throughout the day.  Patient's last menstrual period was 12/09/2023 (approximate).  Has not used any birth control.  There has been no vaginal bleeding.  Partner notes he has been sleeping more.        Past Medical History:  Diagnosis Date   ADD (attention deficit disorder)    ADHD (attention deficit hyperactivity disorder)    Anxiety    Asthma    Bipolar affective disorder (HCC)    Depression    GERD (gastroesophageal reflux disease)    Hyperlipidemia    OCD (obsessive compulsive disorder)    Oppositional defiant disorder     Patient Active Problem List   Diagnosis Date Noted   Sore throat 12/27/2021   Postpartum care following vaginal delivery 07/19/2017   Gestational hypertension 07/16/2017   Abdominal pain affecting pregnancy 05/29/2017   Monilial vulvovaginitis 05/29/2017   UTI (urinary tract infection) during pregnancy, third trimester 05/29/2017   Chest pain 01/19/2016   Dyspnea on exertion 10/20/2015   Fatigue 10/20/2015   Elevated blood pressure 06/16/2015   Attention deficit disorder with hyperactivity 05/21/2015   Allergic rhinitis 05/21/2015   Asthma 05/21/2015   GERD (gastroesophageal reflux disease) 05/21/2015   HLD (hyperlipidemia) 05/21/2015   Extreme obesity 05/21/2015   Apnea, sleep 05/21/2015    Past Surgical History:  Procedure Laterality Date   ORIF ANKLE FRACTURE Right 02/11/2016   Procedure: OPEN REDUCTION INTERNAL FIXATION (ORIF) ANKLE FRACTURE;  Surgeon: Eva Gay, DPM;  Location:  [facility] ORS;  Service: Podiatry;  Laterality: Right;   TONSILLECTOMY AND ADENOIDECTOMY  2007   UPPER GI ENDOSCOPY  10/08/2009   Possible Esophagitis    OB History     Gravida  2   Para  1   Term  1   Preterm  0   AB  0   Living  1      SAB  0   IAB  0   Ectopic  0   Multiple  0   Live Births  1            Home Medications    Prior to Admission medications   Medication Sig Start Date End Date Taking? Authorizing Provider  ondansetron  (ZOFRAN ) 4 MG tablet Take 1 tablet (4 mg total) by mouth every 6 (six) hours. 06/28/23   Victor Lynwood DASEN, PA-C  pantoprazole  (PROTONIX ) 40 MG tablet Take 1 tablet (40 mg total) by mouth daily. Patient not taking: Reported on 05/21/2023 11/21/22 12/21/22  Willo Dunnings, MD  Pseudoephedrine-APAP-DM (DAYQUIL PO) Take 15 mLs by mouth daily as needed (for cold symtoms).    [provider]  albuterol  (VENTOLIN  HFA) 108 (90 Base) MCG/ACT inhaler Inhale 2 puffs into the lungs every 6 (six) hours as needed for wheezing or shortness of breath. Patient not taking: Reported on 07/15/2020 08/20/19 09/24/20  Edelmiro Leash, MD  budesonide-formoterol [facility]) 160-4.5 MCG/ACT inhaler Inhale 2 puffs into the lungs 2 (two) times daily. Patient not taking:  Reported on 07/15/2020  09/24/20  [provider]  ferrous sulfate  325 (65 FE) MG EC tablet Take 325 mg by mouth daily with breakfast. Patient not taking: Reported on 07/15/2020  09/24/20  [provider]  labetalol  (NORMODYNE ) 200 MG tablet Take 1 tablet (200 mg total) by mouth 2 (two) times daily. Patient not taking: Reported on 07/15/2020 07/21/17 09/24/20  Victor Claudell SAUNDERS, MD    Family History Family History  Problem Relation Age of Onset   Seizures Mother    Stroke Mother    Kassie Pour White syndrome Father    Breast cancer Maternal Grandmother    Lupus Maternal Grandmother    Heart attack Maternal Grandfather    CAD Maternal Grandfather        has stent    Bone cancer Maternal Grandfather    Kidney failure Paternal Grandmother    Hypertension Paternal Grandfather    CVA Paternal Grandfather    Diabetes Other    Diabetes Other    Breast cancer Other    Epilepsy Maternal Uncle     Social History Social History   Tobacco Use   Smoking status: Never   Smokeless tobacco: Never  Vaping Use   Vaping status: Every Day   Substances: Nicotine, Flavoring  Substance Use Topics   Alcohol use: No    Alcohol/week: 0.0 standard drinks of alcohol   Drug use: No     Allergies   Aleve [naproxen], Aspirin, Cetirizine, Ibuprofen, Latex, Shellfish allergy, and Other   Review of Systems Review of Systems: :negative unless otherwise stated in HPI.      Physical Exam Triage Vital Signs ED Triage Vitals  Encounter Vitals Group     BP 03/03/24 1640 116/73     Girls Systolic BP Percentile --      Girls Diastolic BP Percentile --      Boys Systolic BP Percentile --      Boys Diastolic BP Percentile --      Pulse Rate 03/03/24 1640 73     Resp 03/03/24 1640 16     Temp 03/03/24 1640 97.8 F (36.6 C)     Temp Source 03/03/24 1640 Oral     SpO2 03/03/24 1640 98 %     Weight 03/03/24 1637 237 lb (107.5 kg)     Height 03/03/24 1637 5' 5 (1.651 m)     Head Circumference --      Peak Flow --      Pain Score 03/03/24 1643 0     Pain Loc --      Pain Education --      Exclude from Growth Chart --    No data found.  Updated Vital Signs BP 116/73 (BP Location: Left Arm)   Pulse 73   Temp 97.8 F (36.6 C) (Oral)   Resp 16   Ht 5' 5 (1.651 m)   Wt 107.5 kg   LMP 12/09/2023 (Approximate)   SpO2 98%   BMI 39.44 kg/m   Visual Acuity Right Eye Distance:   Left Eye Distance:   Bilateral Distance:    Right Eye Near:   Left Eye Near:    Bilateral Near:     Physical Exam GEN: well appearing female in no acute distress  CVS: well perfused  RESP: speaking in full sentences without pause  ABD: soft, non-tender, non-distended, no  palpable masses, uterus below the umbilicus     UC Treatments / Results  Labs (all labs ordered are listed, but  only abnormal results are displayed) Labs Reviewed  PREGNANCY, URINE  HCG, QUANTITATIVE, PREGNANCY    EKG   Radiology No results found.  Procedures Procedures (including critical care time)  Medications Ordered in UC Medications - No data to display  Initial Impression / Assessment and Plan / UC Course  I have reviewed the triage vital signs and the nursing notes.  Pertinent labs & imaging results that were available during my care of the patient were reviewed by me and considered in my medical decision making (see chart for details).      Patient is a 23 y.o.SABRA female  who presents after having positive home pregnancy test last month.  Overall patient is well-appearing and afebrile.  Vital signs stable. Urine pregnancy test is negative. Obtain beta HCG for confirmation.   Return precautions including abdominal pain, fever, chills, nausea, or vomiting given. Discussed MDM, treatment plan and plan for follow-up with patient who agrees with plan.        Final Clinical Impressions(s) / UC Diagnoses   Final diagnoses:  Encounter for pregnancy test, result unknown     Discharge Instructions      Your home pregnancy tests were positive but your pregnancy test here is negative. We will confirm with a blood pregnancy test. This test will result in the next 24 hours. If elevated, you will receive a phone call. If negative, you will see your result in MyChart.      ED Prescriptions   None    PDMP not reviewed this encounter.   Kobe Ofallon, DO 03/03/24 1727

## 2024-03-03 NOTE — Discharge Instructions (Signed)
 Your home pregnancy tests were positive but your pregnancy test here is negative. We will confirm with a blood pregnancy test. This test will result in the next 24 hours. If elevated, you will receive a phone call. If negative, you will see your result in MyChart.

## 2024-03-03 NOTE — ED Triage Notes (Signed)
 Pt presents to [facility] req preg test. Took 2 home tests around June & both were +. LMP:12/09/23.

## 2024-03-04 ENCOUNTER — Ambulatory Visit (HOSPITAL_COMMUNITY): Payer: Self-pay
